# Patient Record
Sex: Male | Born: 1955 | Race: Black or African American | Hispanic: No | Smoking: Former smoker
Health system: Southern US, Community
[De-identification: ages and names within clinical notes are randomized; demographics above are authoritative.]

## PROBLEM LIST (undated history)

## (undated) DIAGNOSIS — H04129 Dry eye syndrome of unspecified lacrimal gland: Secondary | ICD-10-CM

## (undated) DIAGNOSIS — H53009 Unspecified amblyopia, unspecified eye: Secondary | ICD-10-CM

## (undated) DIAGNOSIS — Z87442 Personal history of urinary calculi: Secondary | ICD-10-CM

## (undated) DIAGNOSIS — M1990 Unspecified osteoarthritis, unspecified site: Secondary | ICD-10-CM

## (undated) DIAGNOSIS — I10 Essential (primary) hypertension: Secondary | ICD-10-CM

## (undated) DIAGNOSIS — C801 Malignant (primary) neoplasm, unspecified: Secondary | ICD-10-CM

## (undated) HISTORY — PX: OTHER SURGICAL HISTORY: SHX169

---

## 2001-10-24 ENCOUNTER — Encounter: Payer: Self-pay | Admitting: Family Medicine

## 2001-10-24 ENCOUNTER — Ambulatory Visit (HOSPITAL_COMMUNITY): Admission: RE | Admit: 2001-10-24 | Discharge: 2001-10-24 | Payer: Self-pay | Admitting: Family Medicine

## 2001-12-24 ENCOUNTER — Ambulatory Visit (HOSPITAL_COMMUNITY): Admission: RE | Admit: 2001-12-24 | Discharge: 2001-12-24 | Payer: Self-pay | Admitting: Surgery

## 2003-05-07 ENCOUNTER — Encounter: Admission: RE | Admit: 2003-05-07 | Discharge: 2003-05-07 | Payer: Self-pay | Admitting: Occupational Medicine

## 2004-11-09 ENCOUNTER — Ambulatory Visit (HOSPITAL_COMMUNITY): Admission: RE | Admit: 2004-11-09 | Discharge: 2004-11-09 | Payer: Self-pay | Admitting: Family Medicine

## 2009-12-20 ENCOUNTER — Emergency Department (HOSPITAL_COMMUNITY): Admission: EM | Admit: 2009-12-20 | Discharge: 2009-12-20 | Payer: Self-pay | Admitting: Emergency Medicine

## 2010-08-29 LAB — POCT I-STAT, CHEM 8
BUN: 19 mg/dL (ref 6–23)
Calcium, Ion: 1.19 mmol/L (ref 1.12–1.32)
Chloride: 108 mEq/L (ref 96–112)
Creatinine, Ser: 1.4 mg/dL (ref 0.4–1.5)
Glucose, Bld: 92 mg/dL (ref 70–99)
HCT: 52 % (ref 39.0–52.0)
Hemoglobin: 17.7 g/dL — ABNORMAL HIGH (ref 13.0–17.0)
Potassium: 4.4 mEq/L (ref 3.5–5.1)
Sodium: 141 mEq/L (ref 135–145)
TCO2: 26 mmol/L (ref 0–100)

## 2010-08-29 LAB — RAPID STREP SCREEN (MED CTR MEBANE ONLY): Streptococcus, Group A Screen (Direct): NEGATIVE

## 2010-10-29 NOTE — Op Note (Signed)
[place]  Patient:    Fred Carroll, Fred Carroll Visit Number: [id number] MRN: [id number]          Service Type: END Location: ENDO Attending Physician:  Andre Lefort Dictated by:   Kristine Garbe Ezzard Standing, M.D. Proc. Date: 12/24/01 Admit Date:  12/24/2001   CC:         Patrica Duel, M.D.   Operative Report  CCS#:  [id number]  DATE OF BIRTH:  August 28, 1955  PREOPERATIVE DIAGNOSIS:  Pelvic mass by CT scan with questionable attachment to colon.  POSTOPERATIVE DIAGNOSIS:  Completely normal colonoscopy except for rare diverticulosis.  PROCEDURE:  Flexible colonoscopy.  SURGEON:  Kristine Garbe. Ezzard Standing, M.D.  FIRST ASSISTANT:  None.  ANESTHESIA:  50 mg of Demerol, 4 mg of Versed.  COMPLICATIONS:  None.  INDICATIONS FOR PROCEDURE:  The patient is a 55 year old black male who underwent a CT scan on Oct 24, 2001.  The reading of the CT scan suggested a 1.9 cm pelvic mass which appeared benign but attached to the bowel at the rectosigmoid junction.  A suggestion was made that the patient undergo an endoscopy for evaluation of this mass.  The patient completed a GoLYTELY bowel prep at home. He had an IV in his right hand.  He was monitored with pulse oximetry and EKG and blood pressure cuff and was placed on nasal O2.  An Olympus colonoscope was then passed up his rectum around to his cecum without any trouble.  The ileocecal valve and cecum were visualized.  The right colon, transverse colon, and left colon were unremarkable. He did have maybe two or three diverticula of his sigmoid colon, but I saw no mucosal lesion or mass.  Particular attention was paid to the distal sigmoid colon, proximal rectum, which was suggested on the CT scan of where this mass ought to be; however, I saw no mass, no impingement on the bowel wall, and no mucosal defect.  The scope was unretroflexed within the rectum, and the rectum was unremarkable.  My  impression is that this pelvic mass is not attached or could not be seen from the colonoscope.  We will plan repeat CT scan in six months to ensure stability of the mass.  A discussion carried out with the patients parents at the end of the procedure, and he will see me back in six months. Dictated by:   Kristine Garbe Ezzard Standing, M.D. Attending Physician:  Andre Lefort DD:  12/24/01 TD:  12/25/01 Job: [phone] [id number]

## 2011-06-18 ENCOUNTER — Ambulatory Visit (INDEPENDENT_AMBULATORY_CARE_PROVIDER_SITE_OTHER): Payer: BC Managed Care – PPO

## 2011-06-18 DIAGNOSIS — J40 Bronchitis, not specified as acute or chronic: Secondary | ICD-10-CM

## 2011-06-18 DIAGNOSIS — R059 Cough, unspecified: Secondary | ICD-10-CM

## 2011-06-18 DIAGNOSIS — R05 Cough: Secondary | ICD-10-CM

## 2011-06-18 DIAGNOSIS — N529 Male erectile dysfunction, unspecified: Secondary | ICD-10-CM

## 2014-05-05 ENCOUNTER — Ambulatory Visit (INDEPENDENT_AMBULATORY_CARE_PROVIDER_SITE_OTHER): Payer: BC Managed Care – PPO

## 2014-05-05 ENCOUNTER — Ambulatory Visit (INDEPENDENT_AMBULATORY_CARE_PROVIDER_SITE_OTHER): Payer: BC Managed Care – PPO | Admitting: Podiatry

## 2014-05-05 VITALS — BP 169/88 | HR 74 | Resp 16

## 2014-05-05 DIAGNOSIS — M79672 Pain in left foot: Secondary | ICD-10-CM

## 2014-05-05 DIAGNOSIS — M722 Plantar fascial fibromatosis: Secondary | ICD-10-CM | POA: Insufficient documentation

## 2014-05-05 MED ORDER — TRIAMCINOLONE ACETONIDE 10 MG/ML IJ SUSP
10.0000 mg | Freq: Once | INTRAMUSCULAR | Status: AC
  Administered 2014-05-05: 10 mg

## 2014-05-05 NOTE — Patient Instructions (Signed)
Plantar Fasciitis (Heel Spur Syndrome) with Rehab The plantar fascia is a fibrous, ligament-like, soft-tissue structure that spans the bottom of the foot. Plantar fasciitis is a condition that causes pain in the foot due to inflammation of the tissue. SYMPTOMS   Pain and tenderness on the underneath side of the foot.  Pain that worsens with standing or walking. CAUSES  Plantar fasciitis is caused by irritation and injury to the plantar fascia on the underneath side of the foot. Common mechanisms of injury include:  Direct trauma to bottom of the foot.  Damage to a small nerve that runs under the foot where the main fascia attaches to the heel bone.  Stress placed on the plantar fascia due to bone spurs. RISK INCREASES WITH:   Activities that place stress on the plantar fascia (running, jumping, pivoting, or cutting).  Poor strength and flexibility.  Improperly fitted shoes.  Tight calf muscles.  Flat feet.  Failure to warm-up properly before activity.  Obesity. PREVENTION  Warm up and stretch properly before activity.  Allow for adequate recovery between workouts.  Maintain physical fitness:  Strength, flexibility, and endurance.  Cardiovascular fitness.  Maintain a health body weight.  Avoid stress on the plantar fascia.  Wear properly fitted shoes, including arch supports for individuals who have flat feet. PROGNOSIS  If treated properly, then the symptoms of plantar fasciitis usually resolve without surgery. However, occasionally surgery is necessary. RELATED COMPLICATIONS   Recurrent symptoms that may result in a chronic condition.  Problems of the lower back that are caused by compensating for the injury, such as limping.  Pain or weakness of the foot during push-off following surgery.  Chronic inflammation, scarring, and partial or complete fascia tear, occurring more often from repeated injections. TREATMENT  Treatment initially involves the use of  ice and medication to help reduce pain and inflammation. The use of strengthening and stretching exercises may help reduce pain with activity, especially stretches of the Achilles tendon. These exercises may be performed at home or with a therapist. Your caregiver may recommend that you use heel cups of arch supports to help reduce stress on the plantar fascia. Occasionally, corticosteroid injections are given to reduce inflammation. If symptoms persist for greater than 6 months despite non-surgical (conservative), then surgery may be recommended.  MEDICATION   If pain medication is necessary, then nonsteroidal anti-inflammatory medications, such as aspirin and ibuprofen, or other minor pain relievers, such as acetaminophen, are often recommended.  Do not take pain medication within 7 days before surgery.  Prescription pain relievers may be given if deemed necessary by your caregiver. Use only as directed and only as much as you need.  Corticosteroid injections may be given by your caregiver. These injections should be reserved for the most serious cases, because they may only be given a certain number of times. HEAT AND COLD  Cold treatment (icing) relieves pain and reduces inflammation. Cold treatment should be applied for 10 to 15 minutes every 2 to 3 hours for inflammation and pain and immediately after any activity that aggravates your symptoms. Use ice packs or massage the area with a piece of ice (ice massage).  Heat treatment may be used prior to performing the stretching and strengthening activities prescribed by your caregiver, physical therapist, or athletic trainer. Use a heat pack or soak the injury in warm water. SEEK IMMEDIATE MEDICAL CARE IF:  Treatment seems to offer no benefit, or the condition worsens.  Any medications produce adverse side effects. EXERCISES RANGE   OF MOTION (ROM) AND STRETCHING EXERCISES - Plantar Fasciitis (Heel Spur Syndrome) These exercises may help you  when beginning to rehabilitate your injury. Your symptoms may resolve with or without further involvement from your physician, physical therapist or athletic trainer. While completing these exercises, remember:   Restoring tissue flexibility helps normal motion to return to the joints. This allows healthier, less painful movement and activity.  An effective stretch should be held for at least 30 seconds.  A stretch should never be painful. You should only feel a gentle lengthening or release in the stretched tissue. RANGE OF MOTION - Toe Extension, Flexion  Sit with your right / left leg crossed over your opposite knee.  Grasp your toes and gently pull them back toward the top of your foot. You should feel a stretch on the bottom of your toes and/or foot.  Hold this stretch for __________ seconds.  Now, gently pull your toes toward the bottom of your foot. You should feel a stretch on the top of your toes and or foot.  Hold this stretch for __________ seconds. Repeat __________ times. Complete this stretch __________ times per day.  RANGE OF MOTION - Ankle Dorsiflexion, Active Assisted  Remove shoes and sit on a chair that is preferably not on a carpeted surface.  Place right / left foot under knee. Extend your opposite leg for support.  Keeping your heel down, slide your right / left foot back toward the chair until you feel a stretch at your ankle or calf. If you do not feel a stretch, slide your bottom forward to the edge of the chair, while still keeping your heel down.  Hold this stretch for __________ seconds. Repeat __________ times. Complete this stretch __________ times per day.  STRETCH - Gastroc, Standing  Place hands on wall.  Extend right / left leg, keeping the front knee somewhat bent.  Slightly point your toes inward on your back foot.  Keeping your right / left heel on the floor and your knee straight, shift your weight toward the wall, not allowing your back to  arch.  You should feel a gentle stretch in the right / left calf. Hold this position for __________ seconds. Repeat __________ times. Complete this stretch __________ times per day. STRETCH - Soleus, Standing  Place hands on wall.  Extend right / left leg, keeping the other knee somewhat bent.  Slightly point your toes inward on your back foot.  Keep your right / left heel on the floor, bend your back knee, and slightly shift your weight over the back leg so that you feel a gentle stretch deep in your back calf.  Hold this position for __________ seconds. Repeat __________ times. Complete this stretch __________ times per day. STRETCH - Gastrocsoleus, Standing  Note: This exercise can place a lot of stress on your foot and ankle. Please complete this exercise only if specifically instructed by your caregiver.   Place the ball of your right / left foot on a step, keeping your other foot firmly on the same step.  Hold on to the wall or a rail for balance.  Slowly lift your other foot, allowing your body weight to press your heel down over the edge of the step.  You should feel a stretch in your right / left calf.  Hold this position for __________ seconds.  Repeat this exercise with a slight bend in your right / left knee. Repeat __________ times. Complete this stretch __________ times per day.    STRENGTHENING EXERCISES - Plantar Fasciitis (Heel Spur Syndrome)  These exercises may help you when beginning to rehabilitate your injury. They may resolve your symptoms with or without further involvement from your physician, physical therapist or athletic trainer. While completing these exercises, remember:   Muscles can gain both the endurance and the strength needed for everyday activities through controlled exercises.  Complete these exercises as instructed by your physician, physical therapist or athletic trainer. Progress the resistance and repetitions only as guided. STRENGTH -  Towel Curls  Sit in a chair positioned on a non-carpeted surface.  Place your foot on a towel, keeping your heel on the floor.  Pull the towel toward your heel by only curling your toes. Keep your heel on the floor.  If instructed by your physician, physical therapist or athletic trainer, add ____________________ at the end of the towel. Repeat __________ times. Complete this exercise __________ times per day. STRENGTH - Ankle Inversion  Secure one end of a rubber exercise band/tubing to a fixed object (table, pole). Loop the other end around your foot just before your toes.  Place your fists between your knees. This will focus your strengthening at your ankle.  Slowly, pull your big toe up and in, making sure the band/tubing is positioned to resist the entire motion.  Hold this position for __________ seconds.  Have your muscles resist the band/tubing as it slowly pulls your foot back to the starting position. Repeat __________ times. Complete this exercises __________ times per day.  Document Released: 05/30/2005 Document Revised: 08/22/2011 Document Reviewed: 09/11/2008 [employer] Patient Information 2015 [employer]. This information is not intended to replace advice given to you by your health care provider. Make sure you discuss any questions you have with your health care provider.  

## 2014-05-05 NOTE — Progress Notes (Signed)
   Subjective:    Patient ID: Fred Carroll, male    DOB: 1956/03/06, 58 y.o.   MRN: [id number]  HPI 58 year old male presents the office they with complaints of left heel pain which is been ongoing for approximately one month. He states that he has been using a frozen water bottle massage the bottom of his foot which seems to help alleviate the pain somewhat. He is also tried over-the-counter inserts. He states that he has pain mostly in the mornings or after periods of rest or after he has been on his feet all day. He denies any recent injury or trauma to the area or any recent increase in activity. No other complaints at this time.  Review of Systems  All other systems reviewed and are negative.      Objective:   Physical Exam AAO x3, NAD DP/PT pulses palpable bilaterally, CRT less than 3 seconds Protective sensation intact with Simms Weinstein monofilament, vibratory sensation intact, Achilles tendon reflex intact Tenderness palpation of the plantar medial tubercle of the left calcaneus at the insertion of the plantar fascia. There is no pain along the course of the plantar fascia within the arch of the foot. There is no pain with lateral compression of the calcaneus or pain with vibratory sensation. No pain along the posterior aspect of the calcaneus or along the course/insertion of the Achilles tendon. There is no overlying edema, erythema, increase in warmth. MMT 5/5, ROM WNL No open lesions or pre-ulcerative lesions. No pain with calf compression, swelling, warmth, erythema.       Assessment & Plan:  58 year old male with left heel pain, likely plantar fasciitis. -X-rays were obtained and reviewed with the patient. -Conservative versus surgical treatment discussed including alternatives, risks, complications. -Patient elects to proceed with steroid injection into the left heel. Under sterile skin preparation, a total of 2.5cc of kenalog 10, 0.5% Marcaine plain, and 2%  lidocaine plain were infiltrated into the symptomatic area without complication. A band-aid was applied. Patient tolerated the injection well without complication. Discussed to continue icing over the next couple days to help prevent a steroid flare. -Discussed stretching exercises. -Ice to the affected area. -Discussed shoe gear modification/inserts for which he has done. -Follow-up in 3 weeks. In the meantime, call the office with any questions, concerns, changes symptoms.

## 2014-05-22 ENCOUNTER — Other Ambulatory Visit (HOSPITAL_COMMUNITY): Payer: Self-pay | Admitting: Physician Assistant

## 2014-05-23 ENCOUNTER — Other Ambulatory Visit (HOSPITAL_COMMUNITY): Payer: Self-pay | Admitting: Physician Assistant

## 2014-05-23 DIAGNOSIS — E049 Nontoxic goiter, unspecified: Secondary | ICD-10-CM

## 2014-05-26 ENCOUNTER — Ambulatory Visit (HOSPITAL_COMMUNITY): Payer: BC Managed Care – PPO

## 2014-05-28 ENCOUNTER — Ambulatory Visit: Payer: BC Managed Care – PPO | Admitting: Podiatry

## 2014-05-28 ENCOUNTER — Ambulatory Visit (INDEPENDENT_AMBULATORY_CARE_PROVIDER_SITE_OTHER): Payer: BC Managed Care – PPO | Admitting: Podiatry

## 2014-05-28 VITALS — BP 160/106 | HR 69 | Resp 18

## 2014-05-28 DIAGNOSIS — M79672 Pain in left foot: Secondary | ICD-10-CM

## 2014-05-28 DIAGNOSIS — M722 Plantar fascial fibromatosis: Secondary | ICD-10-CM

## 2014-06-01 NOTE — Progress Notes (Signed)
Patient ID: Fred Carroll, male   DOB: [birth year], 58 y.o.   MRN: [id number]  Subjective: 58 year old male returns the office they for follow-up evaluation of left heel pain, plantar fasciitis. The patient states that he is much improved. He's been continuing with the stretching icing activities. He gets some mild intermittent discomfort at times however states he has significantly improved compared to prior. No acute changes since last appointment and no other complaints at this time. Denies any systemic complaints such as fevers, chills, nausea, vomiting.  Objective: AAO 3, NAD DP/PT pulses palpable bilaterally, CRT less than 3 seconds  Protective sensation intact with Simms Weinstein monofilament, vibratory sensation intact, Achilles tendon reflex intact. At this time there is no tenderness to palpation overlying the plantar medial tubercle of the calcaneus at the insertion the plantar fascia on the left foot. There is no pain with lateral compression of the calcaneus or pain with vibratory sensation. No pain on the posterior aspect of the calcaneus or on the course last insertion of the Achilles tendon. The plantar fascia appears to be intact. There is no overlying edema, erythema, increased warmth. MMT 5/5, ROM WNL No pain to the right lower extremity. No pain lesions or pre-ulcerative lesions.  No pain with calf compression, swelling, warmth, erythema.  Assessment:  58 year old male follow up evaluation left heel pain, plantar fasciitis  Plan: -Treatment options were discussed with the patient including alternatives, risks, complications. -At this time continue stretching and icing exercises. -We'll hold off on another steroid injection at this time. -Dispensed plantar fascial brace per patient's request. -Continue shoe gear modifications. -Follow-up as needed. In the meantime, call the office with any questions, concerns, change in symptoms. Follow-up with PCP for blood  pressure. Currently, the patient is asymptomatic.

## 2015-09-16 ENCOUNTER — Telehealth: Payer: Self-pay

## 2015-09-17 NOTE — Telephone Encounter (Signed)
Gastroenterology Pre-Procedure Review  Request Date: 09/16/2015 Requesting Physician: Rowan Blase, PA  PATIENT REVIEW QUESTIONS: The patient responded to the following health history questions as indicated:    Pt's mom Barnetta Chapel called and gave the info for pt and got him scheduled. He was just busy. She said he does sign for himself.   1. Diabetes Melitis: no 2. Joint replacements in the past 12 months: no 3. Major health problems in the past 3 months: no 4. Has an artificial valve or MVP: no 5. Has a defibrillator: no 6. Has been advised in past to take antibiotics in advance of a procedure like teeth cleaning: no 7. Family history of colon cancer: no  8. Alcohol Use: has a beer maybe once a week 9. History of sleep apnea: no     MEDICATIONS & ALLERGIES:    Patient reports the following regarding taking any blood thinners:   Plavix? no Aspirin? no Coumadin? no  Patient confirms/reports the following medications:  Current Outpatient Prescriptions  Medication Sig Dispense Refill  . losartan-hydrochlorothiazide (HYZAAR) 100-12.5 MG per tablet Take 1 tablet by mouth daily.    . simvastatin (ZOCOR) 20 MG tablet Take 20 mg by mouth daily.     No current facility-administered medications for this visit.    Patient confirms/reports the following allergies:  No Known Allergies  No orders of the defined types were placed in this encounter.    AUTHORIZATION INFORMATION Primary Insurance:  ID #:  Group #:  Pre-Cert / Auth required:  Pre-Cert / Auth #:   Secondary Insurance:   ID #:   Group #:  Pre-Cert / Auth required: Pre-Cert / Auth #:   SCHEDULE INFORMATION: Procedure has been scheduled as follows:  Date:  10/12/2015              Time: 2:30 PM  Location: [facility] Short Stay   This Gastroenterology Pre-Precedure Review Form is being routed to the following provider(s): Barney Drain, MD

## 2015-10-01 NOTE — Telephone Encounter (Signed)
MOVI PREP SPLIT DOSING, REGULAR BREAKFAST. CLEAR LIQUIDS AFTER 9 AM.  

## 2015-10-05 ENCOUNTER — Other Ambulatory Visit: Payer: Self-pay

## 2015-10-05 DIAGNOSIS — Z1211 Encounter for screening for malignant neoplasm of colon: Secondary | ICD-10-CM

## 2015-10-05 MED ORDER — PEG-KCL-NACL-NASULF-NA ASC-C 100 G PO SOLR: 1.0000 | ORAL | Status: DC

## 2015-10-05 NOTE — Telephone Encounter (Signed)
Rx sent to the pharmacy and instructions mailed to pt.  

## 2015-10-07 ENCOUNTER — Telehealth: Payer: Self-pay

## 2015-10-07 NOTE — Telephone Encounter (Signed)
i have faxed the paper work for the PA for her TCS. Waiting for a return fax

## 2015-10-12 ENCOUNTER — Encounter (HOSPITAL_COMMUNITY)
Admission: RE | Disposition: A | Discharge status: Home / Self Care | Payer: Self-pay | Source: Ambulatory Visit | Attending: Gastroenterology

## 2015-10-12 ENCOUNTER — Encounter (HOSPITAL_COMMUNITY): Payer: Self-pay | Admitting: *Deleted

## 2015-10-12 ENCOUNTER — Ambulatory Visit (HOSPITAL_COMMUNITY)
Admission: RE | Admit: 2015-10-12 | Discharge: 2015-10-12 | Disposition: A | Discharge status: Home / Self Care | Payer: BLUE CROSS/BLUE SHIELD | Source: Ambulatory Visit | Attending: Gastroenterology | Admitting: Gastroenterology

## 2015-10-12 DIAGNOSIS — Z79899 Other long term (current) drug therapy: Secondary | ICD-10-CM | POA: Diagnosis not present

## 2015-10-12 DIAGNOSIS — K644 Residual hemorrhoidal skin tags: Secondary | ICD-10-CM | POA: Diagnosis not present

## 2015-10-12 DIAGNOSIS — D123 Benign neoplasm of transverse colon: Secondary | ICD-10-CM | POA: Diagnosis not present

## 2015-10-12 DIAGNOSIS — K648 Other hemorrhoids: Secondary | ICD-10-CM | POA: Diagnosis not present

## 2015-10-12 DIAGNOSIS — Z1211 Encounter for screening for malignant neoplasm of colon: Secondary | ICD-10-CM | POA: Insufficient documentation

## 2015-10-12 DIAGNOSIS — D126 Benign neoplasm of colon, unspecified: Secondary | ICD-10-CM | POA: Diagnosis not present

## 2015-10-12 DIAGNOSIS — F17210 Nicotine dependence, cigarettes, uncomplicated: Secondary | ICD-10-CM | POA: Diagnosis not present

## 2015-10-12 DIAGNOSIS — K5730 Diverticulosis of large intestine without perforation or abscess without bleeding: Secondary | ICD-10-CM | POA: Diagnosis not present

## 2015-10-12 DIAGNOSIS — I10 Essential (primary) hypertension: Secondary | ICD-10-CM | POA: Insufficient documentation

## 2015-10-12 HISTORY — PX: COLONOSCOPY: SHX5424

## 2015-10-12 HISTORY — DX: Essential (primary) hypertension: I10

## 2015-10-12 SURGERY — COLONOSCOPY
Anesthesia: Moderate Sedation

## 2015-10-12 MED ORDER — SODIUM CHLORIDE 0.9 % IV SOLN
INTRAVENOUS | Status: DC
  Administered 2015-10-12: 14:00:00 via INTRAVENOUS

## 2015-10-12 MED ORDER — MEPERIDINE HCL 100 MG/ML IJ SOLN
INTRAMUSCULAR | Status: AC
  Filled 2015-10-12: qty 2

## 2015-10-12 MED ORDER — MIDAZOLAM HCL 5 MG/5ML IJ SOLN
INTRAMUSCULAR | Status: AC
  Filled 2015-10-12: qty 10

## 2015-10-12 MED ORDER — MIDAZOLAM HCL 5 MG/5ML IJ SOLN
INTRAMUSCULAR | Status: DC | PRN
  Administered 2015-10-12 (×2): 2 mg via INTRAVENOUS

## 2015-10-12 MED ORDER — MEPERIDINE HCL 100 MG/ML IJ SOLN
INTRAMUSCULAR | Status: DC | PRN
  Administered 2015-10-12: 25 mg via INTRAVENOUS
  Administered 2015-10-12: 50 mg via INTRAVENOUS

## 2015-10-12 NOTE — H&P (Signed)
  Primary Care Physician:  Purvis Kilts, MD Primary Gastroenterologist:  Dr. Oneida Alar  Pre-Procedure History & Physical: HPI:  Fred Carroll is a 60 y.o. male here for [place].  Past Medical History  Diagnosis Date  . Hypertension     History reviewed. No pertinent past surgical history.  Prior to Admission medications   Medication Sig Start Date End Date Taking? Authorizing Provider  losartan-hydrochlorothiazide (HYZAAR) 100-12.5 MG per tablet Take 1 tablet by mouth daily.   Yes Historical Provider, MD  peg 3350 powder (MOVIPREP) 100 g SOLR Take 1 kit (200 g total) by mouth as directed. 10/05/15  Yes Danie Binder, MD  simvastatin (ZOCOR) 20 MG tablet Take 20 mg by mouth daily.   Yes Historical Provider, MD    Allergies as of 10/05/2015  . (No Known Allergies)    History reviewed. No pertinent family history.  Social History   Social History  . Marital Status: Divorced    Spouse Name: N/A  . Number of Children: N/A  . Years of Education: N/A   Occupational History  . Not on file.   Social History Main Topics  . Smoking status: Current Some Day Smoker    Types: Cigarettes  . Smokeless tobacco: Not on file  . Alcohol Use: Not on file  . Drug Use: Not on file  . Sexual Activity: Not on file   Other Topics Concern  . Not on file   Social History Narrative  . No narrative on file    Review of Systems: See HPI, otherwise negative ROS   Physical Exam: There were no vitals taken for this visit. General:   Alert,  pleasant and cooperative in NAD Head:  Normocephalic and atraumatic. Neck:  Supple; Lungs:  Clear throughout to auscultation.    Heart:  Regular rate and rhythm. Abdomen:  Soft, nontender and nondistended. Normal bowel sounds, without guarding, and without rebound.   Neurologic:  Alert and  oriented x4;  grossly normal neurologically.  Impression/Plan:     SCREENING  Plan:  1. TCS TODAY

## 2015-10-12 NOTE — Op Note (Signed)
[facility] Patient Name: Fred Carroll Procedure Date: 10/12/2015 1:41 PM MRN: [id number] Date of Birth: Jul 19, 1955 Attending MD: Barney Drain , MD CSN: [id number] Age: 60 Admit Type: Outpatient Procedure:                Colonoscopy Indications:              Screening for colorectal malignant neoplasm Providers:                Barney Drain, MD, Janeece Riggers, RN, Randa Spike,                            Technician Referring MD:              Medicines:                Meperidine 75 mg IV, Midazolam 4 mg IV Complications:            No immediate complications. Estimated Blood Loss:     Estimated blood loss was minimal. Procedure:                Pre-Anesthesia Assessment:                           - Prior to the procedure, a History and Physical                            was performed, and patient medications and                            allergies were reviewed. The patient's tolerance of                            previous anesthesia was also reviewed. The risks                            and benefits of the procedure and the sedation                            options and risks were discussed with the patient.                            All questions were answered, and informed consent                            was obtained. Prior Anticoagulants: The patient has                            taken no previous anticoagulant or antiplatelet                            agents. ASA Grade Assessment: I - A normal, healthy                            patient. After reviewing the risks and benefits,  the patient was deemed in satisfactory condition to                            undergo the procedure.                           After obtaining informed consent, the colonoscope                            was passed under direct vision. Throughout the                            procedure, the patient's blood pressure, pulse, and   oxygen saturations were monitored continuously. The                            [id number] scope was introduced through                            the anus and advanced to the the cecum, identified                            by appendiceal orifice and ileocecal valve. The                            ileocecal valve, appendiceal orifice, and rectum                            were photographed. The colonoscopy was performed                            with ease. The patient tolerated the procedure                            well. The quality of the bowel preparation was                            excellent. Scope In: 2:24:37 PM Scope Out: 2:50:05 PM Scope Withdrawal Time: 0 hours 21 minutes 39 seconds  Total Procedure Duration: 0 hours 25 minutes 28 seconds  Findings:      The digital rectal exam was normal.      Three sessile polyps were found in the distal transverse colon and       hepatic flexure. The polyps were 6 to 7 mm in size. These polyps were       removed with a hot snare. Resection and retrieval were complete.      Three sessile polyps were found in the distal transverse colon and       hepatic flexure. The polyps were 2 to 4 mm in size. These polyps were       removed with a cold biopsy forceps. Resection and retrieval were       complete.      Multiple small and large-mouthed diverticula were found in the sigmoid       colon, descending colon and distal transverse colon.      Non-bleeding external and internal  hemorrhoids were found. Impression:               - Three 6 to 7 mm polyps in the distal transverse                            colon and at the hepatic flexure, removed with a                            hot snare. Resected and retrieved.                           - Three 2 to 4 mm polyps in the distal transverse                            colon and at the hepatic flexure, removed with a                            cold biopsy forceps. Resected and retrieved.                            - Diverticulosis in the sigmoid colon, in the                            descending colon and in the distal transverse colon.                           - Non-bleeding external and internal hemorrhoids. Moderate Sedation:      Moderate (conscious) sedation was administered by the endoscopy nurse       and supervised by the endoscopist. The following parameters were       monitored: oxygen saturation, heart rate, blood pressure, and response       to care. Total physician intraservice time was 39 minutes. Recommendation:           - Patient has a contact number available for                            emergencies. The signs and symptoms of potential                            delayed complications were discussed with the                            patient. Return to normal activities tomorrow.                            Written discharge instructions were provided to the                            patient.                           - High fiber diet.                           -  Continue present medications.                           - Await pathology results.                           - Repeat colonoscopy in 3 - 5 years for                            surveillance. Procedure Code(s):        --- Professional ---                           [phone], Colonoscopy, flexible; with removal of                            tumor(s), polyp(s), or other lesion(s) by snare                            technique                           45380, 59, Colonoscopy, flexible; with biopsy,                            single or multiple                           99153, Moderate sedation services; each additional                            15 minutes intraservice time                           99153, Moderate sedation services; each additional                            15 minutes intraservice time                           G0500, Moderate sedation services provided by the                             same physician or other qualified health care                            professional performing a gastrointestinal                            endoscopic service that sedation supports,                            requiring the presence of an independent trained                            observer to assist in the monitoring of the  patient's level of consciousness and physiological                            status; initial 15 minutes of intra-service time;                            patient age 58 years or older (additional time may                            be reported with [phone], as appropriate) Diagnosis Code(s):        --- Professional ---                           Z12.11, Encounter for screening for malignant                            neoplasm of colon                           D12.3, Benign neoplasm of transverse colon (hepatic                            flexure or splenic flexure)                           K64.8, Other hemorrhoids                           K57.30, Diverticulosis of large intestine without                            perforation or abscess without bleeding CPT copyright 2016 [employer] Association. All rights reserved. The codes documented in this report are preliminary and upon coder review may  be revised to meet current compliance requirements. Barney Drain, MD Barney Drain, MD 10/12/2015 3:32:43 PM This report has been signed electronically. Number of Addenda: 0

## 2015-10-12 NOTE — Discharge Instructions (Addendum)
You have small internal hemorrhoids. YOU HAD SIX POLYPS REMOVED FROM YOUR RIGHT COLON.   DRINK WATER TO KEEP YOUR URINE LIGHT YELLOW.  FOLLOW A HIGH FIBER DIET. AVOID ITEMS THAT CAUSE BLOATING. See info below.  YOUR BIOPSY RESULTS WILL BE AVAILABLE IN MY CHART MAY 4 AND MY OFFICE WILL CONTACT YOU IN 10-14 DAYS WITH YOUR RESULTS.   Next colonoscopy in 3-5 years.  Colonoscopy Care After Read the instructions outlined below and refer to this sheet in the next week. These discharge instructions provide you with general information on caring for yourself after you leave the hospital. While your treatment has been planned according to the most current medical practices available, unavoidable complications occasionally occur. If you have any problems or questions after discharge, call DR. FIELDS, [phone].  ACTIVITY  You may resume your regular activity, but move at a slower pace for the next 24 hours.   Take frequent rest periods for the next 24 hours.   Walking will help get rid of the air and reduce the bloated feeling in your belly (abdomen).   No driving for 24 hours (because of the medicine (anesthesia) used during the test).   You may shower.   Do not sign any important legal documents or operate any machinery for 24 hours (because of the anesthesia used during the test).    NUTRITION  Drink plenty of fluids.   You may resume your normal diet as instructed by your doctor.   Begin with a light meal and progress to your normal diet. Heavy or fried foods are harder to digest and may make you feel sick to your stomach (nauseated).   Avoid alcoholic beverages for 24 hours or as instructed.    MEDICATIONS  You may resume your normal medications.   WHAT YOU CAN EXPECT TODAY  Some feelings of bloating in the abdomen.   Passage of more gas than usual.   Spotting of blood in your stool or on the toilet paper  .  IF YOU HAD POLYPS REMOVED DURING THE COLONOSCOPY:  Eat  a soft diet IF YOU HAVE NAUSEA, BLOATING, ABDOMINAL PAIN, OR VOMITING.    FINDING OUT THE RESULTS OF YOUR TEST Not all test results are available during your visit. DR. Oneida Alar WILL CALL YOU WITHIN 14 DAYS OF YOUR PROCEDUE WITH YOUR RESULTS. Do not assume everything is normal if you have not heard from DR. FIELDS, CALL HER OFFICE AT [phone].  SEEK IMMEDIATE MEDICAL ATTENTION AND CALL THE OFFICE: [phone] IF:  You have more than a spotting of blood in your stool.   Your belly is swollen (abdominal distention).   You are nauseated or vomiting.   You have a temperature over 101F.   You have abdominal pain or discomfort that is severe or gets worse throughout the day.  High-Fiber Diet A high-fiber diet changes your normal diet to include more whole grains, legumes, fruits, and vegetables. Changes in the diet involve replacing refined carbohydrates with unrefined foods. The calorie level of the diet is essentially unchanged. The Dietary Reference Intake (recommended amount) for adult males is 38 grams per day. For adult females, it is 25 grams per day. Pregnant and lactating women should consume 28 grams of fiber per day. Fiber is the intact part of a plant that is not broken down during digestion. Functional fiber is fiber that has been isolated from the plant to provide a beneficial effect in the body. PURPOSE  Increase stool bulk.   Ease and regulate  bowel movements.   Lower cholesterol.  REDUCE RISK OF COLON CANCER  INDICATIONS THAT YOU NEED MORE FIBER  Constipation and hemorrhoids.   Uncomplicated diverticulosis (intestine condition) and irritable bowel syndrome.   Weight management.   As a protective measure against hardening of the arteries (atherosclerosis), diabetes, and cancer.   GUIDELINES FOR INCREASING FIBER IN THE DIET  Start adding fiber to the diet slowly. A gradual increase of about 5 more grams (2 slices of whole-wheat bread, 2 servings of most fruits or  vegetables, or 1 bowl of high-fiber cereal) per day is best. Too rapid an increase in fiber may result in constipation, flatulence, and bloating.   Drink enough water and fluids to keep your urine clear or pale yellow. Water, juice, or caffeine-free drinks are recommended. Not drinking enough fluid may cause constipation.   Eat a variety of high-fiber foods rather than one type of fiber.   Try to increase your intake of fiber through using high-fiber foods rather than fiber pills or supplements that contain small amounts of fiber.   The goal is to change the types of food eaten. Do not supplement your present diet with high-fiber foods, but replace foods in your present diet.   INCLUDE A VARIETY OF FIBER SOURCES  Replace refined and processed grains with whole grains, canned fruits with fresh fruits, and incorporate other fiber sources. White rice, white breads, and most bakery goods contain little or no fiber.   Brown whole-grain rice, buckwheat oats, and many fruits and vegetables are all good sources of fiber. These include: broccoli, Brussels sprouts, cabbage, cauliflower, beets, sweet potatoes, white potatoes (skin on), carrots, tomatoes, eggplant, squash, berries, fresh fruits, and dried fruits.   Cereals appear to be the richest source of fiber. Cereal fiber is found in whole grains and bran. Bran is the fiber-rich outer coat of cereal grain, which is largely removed in refining. In whole-grain cereals, the bran remains. In breakfast cereals, the largest amount of fiber is found in those with "bran" in their names. The fiber content is sometimes indicated on the label.   You may need to include additional fruits and vegetables each day.   In baking, for 1 cup white flour, you may use the following substitutions:   1 cup whole-wheat flour minus 2 tablespoons.   1/2 cup white flour plus 1/2 cup whole-wheat flour.   Polyps, Colon  A polyp is extra tissue that grows inside your body.  Colon polyps grow in the large intestine. The large intestine, also called the colon, is part of your digestive system. It is a long, hollow tube at the end of your digestive tract where your body makes and stores stool. Most polyps are not dangerous. They are benign. This means they are not cancerous. But over time, some types of polyps can turn into cancer. Polyps that are smaller than a pea are usually not harmful. But larger polyps could someday become or may already be cancerous. To be safe, doctors remove all polyps and test them.   PREVENTION There is not one sure way to prevent polyps. You might be able to lower your risk of getting them if you:  Eat more fruits and vegetables and less fatty food.   Do not smoke.   Avoid alcohol.   Exercise every day.   Lose weight if you are overweight.   Eating more calcium and folate can also lower your risk of getting polyps. Some foods that are rich in calcium are milk,  cheese, and broccoli. Some foods that are rich in folate are chickpeas, kidney beans, and spinach.    Diverticulosis Diverticulosis is a common condition that develops when small pouches (diverticula) form in the wall of the colon. The risk of diverticulosis increases with age. It happens more often in people who eat a low-fiber diet. Most individuals with diverticulosis have no symptoms. Those individuals with symptoms usually experience belly (abdominal) pain, constipation, or loose stools (diarrhea).  HOME CARE INSTRUCTIONS  Increase the amount of fiber in your diet as directed by your caregiver or dietician. This may reduce symptoms of diverticulosis.   Drink at least 6 to 8 glasses of water each day to prevent constipation.   Try not to strain when you have a bowel movement.   Avoiding nuts and seeds to prevent complications is NOT NECESSARY.   FOODS HAVING HIGH FIBER CONTENT INCLUDE:  Fruits. Apple, peach, pear, tangerine, raisins, prunes.   Vegetables. Brussels  sprouts, asparagus, broccoli, cabbage, carrot, cauliflower, romaine lettuce, spinach, summer squash, tomato, winter squash, zucchini.   Starchy Vegetables. Baked beans, kidney beans, lima beans, split peas, lentils, potatoes (with skin).   Grains. Whole wheat bread, brown rice, bran flake cereal, plain oatmeal, white rice, shredded wheat, bran muffins.   SEEK IMMEDIATE MEDICAL CARE IF:  You develop increasing pain or severe bloating.   You have an oral temperature above 101F.   You develop vomiting or bowel movements that are bloody or black.   Hemorrhoids Hemorrhoids are dilated (enlarged) veins around the rectum. Sometimes clots will form in the veins. This makes them swollen and painful. These are called thrombosed hemorrhoids. Causes of hemorrhoids include:  Constipation.   Straining to have a bowel movement.   HEAVY LIFTING  HOME CARE INSTRUCTIONS  Eat a well balanced diet and drink 6 to 8 glasses of water every day to avoid constipation. You may also use a bulk laxative.   Avoid straining to have bowel movements.   Keep anal area dry and clean.   Do not use a donut shaped pillow or sit on the toilet for long periods. This increases blood pooling and pain.   Move your bowels when your body has the urge; this will require less straining and will decrease pain and pressure.

## 2015-10-16 ENCOUNTER — Encounter (HOSPITAL_COMMUNITY): Payer: Self-pay | Admitting: Gastroenterology

## 2015-10-20 ENCOUNTER — Telehealth: Payer: Self-pay | Admitting: Gastroenterology

## 2015-10-20 NOTE — Telephone Encounter (Signed)
Pt called for results of colonoscopy. Please call [phone]

## 2015-10-27 NOTE — Telephone Encounter (Signed)
DRINK WATER TO KEEP YOUR URINE LIGHT YELLOW.  FOLLOW A HIGH FIBER DIET. AVOID ITEMS THAT CAUSE BLOATING.  Next colonoscopy in 3 years.

## 2015-10-27 NOTE — Telephone Encounter (Signed)
Called and [facility] for pt that I am very sorry for the delay in returning his call. I have been out sick some and I am very sorry. We do have 7-10 business days to get the results and I realize that time is here now. Dr. Oneida Alar is on vacation and will be back at the hospital on Friday this week.  Also, said for him to call me if he has problems or concerns at this time.

## 2015-10-27 NOTE — Telephone Encounter (Signed)
LMOM to call.

## 2015-10-28 NOTE — Telephone Encounter (Signed)
Reminder in epic °

## 2015-11-02 NOTE — Telephone Encounter (Signed)
L/m for pt to return call

## 2015-11-03 NOTE — Telephone Encounter (Signed)
PT is aware of results.  

## 2016-08-31 ENCOUNTER — Ambulatory Visit (INDEPENDENT_AMBULATORY_CARE_PROVIDER_SITE_OTHER): Payer: BLUE CROSS/BLUE SHIELD | Admitting: Physician Assistant

## 2016-08-31 ENCOUNTER — Ambulatory Visit: Payer: BLUE CROSS/BLUE SHIELD

## 2016-08-31 VITALS — BP 108/80 | HR 71 | Temp 97.9°F | Resp 16 | Ht 67.0 in | Wt 194.0 lb

## 2016-08-31 DIAGNOSIS — M79672 Pain in left foot: Secondary | ICD-10-CM

## 2016-08-31 DIAGNOSIS — M722 Plantar fascial fibromatosis: Secondary | ICD-10-CM | POA: Diagnosis not present

## 2016-08-31 NOTE — Progress Notes (Signed)
   Fred Carroll  MRN: [id number] DOB: 1956/01/30  PCP: Purvis Kilts, MD  Subjective:  Pt is a 61 year old male who presents to clinic for left heel pain.  H/o plantar fasciitis of left foot, this is a known problem for pt. Has seen ortho in the past - had steroid injections in 2015. He felt good until about 3 months ago. 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 He would like a note asking to be switched back to his former position on the belt.  He wears steel-toe boots with orthotics.  Denies n/t, swelling, color changes, temperature changes.  He has appt next week with [facility].   Review of Systems  Musculoskeletal: Positive for arthralgias (left heel) and gait problem.    Patient Active Problem List   Diagnosis Date Noted  . Special screening for malignant neoplasms, colon   . Plantar fasciitis of left foot 05/05/2014    Current Outpatient Prescriptions on File Prior to Visit  Medication Sig Dispense Refill  . losartan-hydrochlorothiazide (HYZAAR) 100-12.5 MG per tablet Take 1 tablet by mouth daily.    . simvastatin (ZOCOR) 20 MG tablet Take 20 mg by mouth daily.     No current facility-administered medications on file prior to visit.     No Known Allergies   Objective:  BP 108/80   Pulse 71   Temp 97.9 F (36.6 C) (Oral)   Resp 16   Ht 5\' 7"  (1.702 m)   Wt 194 lb (88 kg)   SpO2 96%   BMI 30.38 kg/m   Physical Exam  Constitutional: He is oriented to person, place, and time and well-developed, well-nourished, and in no distress. No distress.  Cardiovascular: Normal rate, regular rhythm and normal heart sounds.   Musculoskeletal:       Left foot: There is tenderness (calcaneous ). There is normal range of motion, no bony tenderness, no swelling, normal capillary refill, no deformity and no laceration.  Neurological: He is alert  and oriented to person, place, and time. GCS score is 15.  Skin: Skin is warm and dry.  Psychiatric: Mood, memory, affect and judgment normal.  Vitals reviewed.   Assessment and Plan :  1. Plantar fasciitis of left foot 2. Foot pain, left - Increased b/l foot pain likely due to exacerbation of plantar fasciitis due to his recent position change at work. Will write letter requesting him to be switched back to former position. Advised pt to keep f/u appt with ortho next week. RTC in symptoms worsen.    Mercer Pod, PA-C  [facility] [address] 08/31/2016 12:16 PM

## 2016-08-31 NOTE — Patient Instructions (Signed)
     IF you received an x-ray today, you will receive an invoice from [facility]. Please contact [facility] at [phone] with questions or concerns regarding your invoice.   IF you received labwork today, you will receive an invoice from [facility]. Please contact [facility] at [phone] with questions or concerns regarding your invoice.   Our billing staff will not be able to assist you with questions regarding bills from these companies.  You will be contacted with the lab results as soon as they are available. The fastest way to get your results is to activate your My Chart account. Instructions are located on the last page of this paperwork. If you have not heard from us regarding the results in 2 weeks, please contact this office.     

## 2016-09-09 ENCOUNTER — Encounter: Payer: Self-pay | Admitting: Podiatry

## 2016-09-09 ENCOUNTER — Ambulatory Visit (INDEPENDENT_AMBULATORY_CARE_PROVIDER_SITE_OTHER): Payer: BLUE CROSS/BLUE SHIELD | Admitting: Podiatry

## 2016-09-09 DIAGNOSIS — M722 Plantar fascial fibromatosis: Secondary | ICD-10-CM | POA: Diagnosis not present

## 2016-09-09 MED ORDER — TRIAMCINOLONE ACETONIDE 10 MG/ML IJ SUSP
10.0000 mg | Freq: Once | INTRAMUSCULAR | Status: AC
  Administered 2016-09-09: 10 mg

## 2016-09-09 MED ORDER — MELOXICAM 15 MG PO TABS: 15.0000 mg | ORAL_TABLET | Freq: Every day | ORAL | 2 refills | Status: DC

## 2016-09-09 NOTE — Progress Notes (Signed)
Subjective: 61 year old male presents the office today for concerns or reoccurrence of left heel pain. He states that her last couple days he started no some mild recurrence of pain he went to have an injection done before the pain to be sutured severe. He did purchase an over-the-counter brace which helps some but he is asking for another brace as well. He has been stretching icing an intermittent basis but not continual. 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 He denies any numbness or tingling. The pain is at night. No other complaints today. Denies any systemic complaints such as fevers, chills, nausea, vomiting. No acute changes since last appointment.  Objective: AAO x3, NAD DP/PT pulses palpable bilaterally, CRT less than 3 seconds There is mild reoccurrence of tenderness to palpation along the plantar medial tubercle of the calcaneus at the insertion of plantar fascia on the left foot. There is no pain along the course of the plantar fascia within the arch of the foot. Plantar fascia appears to be intact. There is no pain with lateral compression of the calcaneus or pain with vibratory sensation. There is no pain along the course or insertion of the achilles tendon. No other areas of tenderness to bilateral lower extremities. No open lesions or pre-ulcerative lesions.  No pain with calf compression, swelling, warmth, erythema  Assessment: Left heel pain, plantar fasciitis recurrence  Plan: -All treatment options discussed with the patient including all alternatives, risks, complications.  -Patient elects to proceed with steroid injection into the left heel. Under sterile skin preparation, a total of 2.5cc of kenalog 10, 0.5% Marcaine plain, and 2% lidocaine plain were infiltrated into the symptomatic area without complication. A band-aid was applied. Patient tolerated the injection well without complication. Post-injection care with discussed with the patient. Discussed  with the patient to ice the area over the next couple of days to help prevent a steroid flare.  -Plantar fascial braces Spence. -Stretching, icing exercises daily. -Discussed the symptoms continue custom molded orthotic. He has purchased an over-the-counter insert which is helping some. -RTC in 3-4 weeks or sooner if needed. Call any questions concerns meantime.  Celesta Gentile, DPM

## 2016-09-09 NOTE — Patient Instructions (Signed)

## 2016-10-07 ENCOUNTER — Encounter: Payer: Self-pay | Admitting: Podiatry

## 2016-10-07 ENCOUNTER — Ambulatory Visit (INDEPENDENT_AMBULATORY_CARE_PROVIDER_SITE_OTHER): Payer: BLUE CROSS/BLUE SHIELD | Admitting: Podiatry

## 2016-10-07 DIAGNOSIS — M722 Plantar fascial fibromatosis: Secondary | ICD-10-CM | POA: Diagnosis not present

## 2016-10-07 NOTE — Patient Instructions (Signed)
Bunionectomy, Care After Refer to this sheet in the next few weeks. These instructions provide you with information about caring for yourself after your procedure. Your health care provider may also give you more specific instructions. Your treatment has been planned according to current medical practices, but problems sometimes occur. Call your health care provider if you have any problems or questions after your procedure. What can I expect after the procedure? After your procedure, it is typical to have the following:  Pain.  Swelling. Follow these instructions at home:  Take medicines only as directed by your health care provider.  Apply ice to the affected area:  Put ice in a plastic bag.  Place a towel between your skin and the bag.  Leave the ice on for 20 minutes, 2-3 times a day.  There are many different ways to close and cover an incision, including stitches (sutures), skin glue, and adhesive strips. Follow your health care provider's instructions about:  Incision care.  Bandage (dressing) changes and removal.  Incision closure removal.  Keep your foot raised (elevated) while you are resting.  You may need to wear a brace or a boot that keeps your foot in the proper position. Wear the boot or brace as directed by your health care provider.  Use crutches, canes, or walkers as directed by your health care provider.  Do not put weight on your foot until your health care provider approves.  Rest as needed. Follow your health care provider's instructions on when you can return to your usual activities, like walking and driving.  Do not wear high heels or tight-fitting shoes.  Do physical therapy exercises to strengthen your foot as directed by your health care provider. Contact a health care provider if:  You have increased bleeding from the incision.  You have drainage, redness, swelling, or pain at your incision site.  You have a fever or chills.  You notice a  bad smell coming from the incision or the dressing.  Your dressing gets wet or falls off.  Your calf begins to swell.  Your toes feel numb or stiff. Get help right away if:  You have a rash.  You have difficulty breathing. This information is not intended to replace advice given to you by your health care provider. Make sure you discuss any questions you have with your health care provider. Document Released: 12/17/2004 Document Revised: 11/05/2015 Document Reviewed: 01/15/2014 Elsevier Interactive Patient Education  2017 [employer].

## 2016-10-10 NOTE — Progress Notes (Signed)
Subjective: Fred Carroll presents to the office today for follow-up evaluation of left heel pain. They state that they are doing better and not having any pain at this time. They have been icing, stretching, try to wear supportive shoe as much as possible. No other complaints at this time. No acute changes since last appointment. They deny any systemic complaints such as fevers, chills, nausea, vomiting.  Objective: General: AAO x3, NAD  Dermatological: Skin is warm, dry and supple bilateral. Nails x 10 are well manicured; remaining integument appears unremarkable at this time. There are no open sores, no preulcerative lesions, no rash or signs of infection present.  Vascular: Dorsalis Pedis artery and Posterior Tibial artery pedal pulses are 2/4 bilateral with immedate capillary fill time. Pedal hair growth present. There is no pain with calf compression, swelling, warmth, erythema.   Neruologic: Grossly intact via light touch bilateral. Vibratory intact via tuning fork bilateral. Protective threshold with Semmes Wienstein monofilament intact to all pedal sites bilateral.   Musculoskeletal: There is no signficiant tenderness to palpation along the plantar medial tubercle of the calcaneus at the insertion of the plantar fascia on the left foot. There is no pain along the course of the plantar fascia within the arch of the foot. Plantar fascia appears to be intact bilaterally. There is no pain with lateral compression of the calcaneus and there is no pain with vibratory sensation. There is no pain along the course or insertion of the Achilles tendon. There are no other areas of tenderness to bilateral lower extremities. No gross boney pedal deformities bilateral. No pain, crepitus, or limitation noted with foot and ankle range of motion bilateral. Muscular strength 5/5 in all groups tested bilateral.  Gait: Unassisted, Nonantalgic.   Assessment: Presents for follow-up evaluation for heel  pain, likely plantar fasciitis which is much improved.   Plan: -Treatment options discussed including all alternatives, risks, and complications -At this time the wound is much improved. Discussed with the measures to help prevent recurrence again. Continue with stretching, icing exercises daily discussed supportive shoe gear and orthotics. -Follow-up as needed. In the meantime, encouraged to call the office with any questions, concerns, change in symptoms.   Celesta Gentile, DPM

## 2016-12-23 ENCOUNTER — Ambulatory Visit (INDEPENDENT_AMBULATORY_CARE_PROVIDER_SITE_OTHER): Payer: BLUE CROSS/BLUE SHIELD | Admitting: Physician Assistant

## 2016-12-23 ENCOUNTER — Encounter: Payer: Self-pay | Admitting: Physician Assistant

## 2016-12-23 VITALS — BP 161/99 | HR 71 | Temp 98.1°F | Resp 18 | Ht 67.0 in | Wt 190.6 lb

## 2016-12-23 DIAGNOSIS — M62838 Other muscle spasm: Secondary | ICD-10-CM | POA: Diagnosis not present

## 2016-12-23 DIAGNOSIS — M542 Cervicalgia: Secondary | ICD-10-CM | POA: Diagnosis not present

## 2016-12-23 MED ORDER — CYCLOBENZAPRINE HCL 10 MG PO TABS: 10.0000 mg | ORAL_TABLET | Freq: Three times a day (TID) | ORAL | 0 refills | Status: DC | PRN

## 2016-12-23 MED ORDER — MELOXICAM 15 MG PO TABS: 15.0000 mg | ORAL_TABLET | Freq: Every day | ORAL | 2 refills | Status: AC

## 2016-12-23 NOTE — Progress Notes (Signed)
   Fred Carroll  MRN: [id number] DOB: 1955-07-23  PCP: Sharilyn Sites, MD  Subjective:  Pt is a right handed 61 year old male who presents to clinic for neck and arm pain. He was at work yesterday when pain started. Located on the left side of his neck. 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 Pain is worse when he tilts head back to gargle. Yesterday he could not look over his right shoulder due to neck pain. He applied pain patches to the area - this is helping some.  Denies n/t, muscle weakness, neck stiffness, n/v, chest pain, chest pressure, palpitations.    Review of Systems  Constitutional: Negative for chills and diaphoresis.  Respiratory: Negative for cough, chest tightness, shortness of breath and wheezing.   Cardiovascular: Negative for chest pain, palpitations and leg swelling.  Gastrointestinal: Negative for nausea.  Musculoskeletal: Positive for myalgias (left shoulder) and neck pain.  Neurological: Negative for dizziness, syncope, light-headedness and headaches.  Psychiatric/Behavioral: Negative for sleep disturbance. The patient is not nervous/anxious.     Patient Active Problem List   Diagnosis Date Noted  . Special screening for malignant neoplasms, colon   . Plantar fasciitis of left foot 05/05/2014    Current Outpatient Prescriptions on File Prior to Visit  Medication Sig Dispense Refill  . losartan-hydrochlorothiazide (HYZAAR) 100-12.5 MG per tablet Take 1 tablet by mouth daily.    . simvastatin (ZOCOR) 20 MG tablet Take 20 mg by mouth daily.    . meloxicam (MOBIC) 15 MG tablet Take 1 tablet (15 mg total) by mouth daily. (Patient not taking: Reported on 12/23/2016) 30 tablet 2   No current facility-administered medications on file prior to visit.     No Known Allergies   Objective:  BP (!) 162/105 (BP Location: Right Arm, Patient Position: Sitting, Cuff Size: Normal)   Pulse 71   Temp 98.1 F (36.7 C) (Oral)   Resp 18   Ht 5\' 7"  (1.702 m)    Wt 190 lb 9.6 oz (86.5 kg)   SpO2 96%   BMI 29.85 kg/m   Physical Exam  Constitutional: He is oriented to person, place, and time and well-developed, well-nourished, and in no distress. No distress.  Cardiovascular: Normal rate, regular rhythm and normal heart sounds.   Musculoskeletal:       Left shoulder: He exhibits tenderness and spasm. He exhibits normal range of motion, no bony tenderness, no swelling and normal strength.       Cervical back: He exhibits spasm. He exhibits normal range of motion, no tenderness and no bony tenderness.  Neurological: He is alert and oriented to person, place, and time. GCS score is 15.  Skin: Skin is warm and dry.  Psychiatric: Mood, memory, affect and judgment normal.  Vitals reviewed.   Assessment and Plan :  1. Neck pain 2. Muscle spasm of left shoulder - cyclobenzaprine (FLEXERIL) 10 MG tablet; Take 1 tablet (10 mg total) by mouth 3 (three) times daily as needed for muscle spasms.  Dispense: 30 tablet; Refill: 0 - meloxicam (MOBIC) 15 MG tablet; Take 1 tablet (15 mg total) by mouth daily.  Dispense: 30 tablet; Refill: 2 - Suspect pain is musculoskeletal in origin due to repetitive motions at his workplace.. Advised heat, stretches, massage, hydration. Stretches demonstrated to pt. RTC in 3-4 weeks if no improvement. He agrees with plan.   Mercer Pod, PA-C  [facility] 12/23/2016 11:32 AM

## 2016-12-23 NOTE — Patient Instructions (Addendum)
  Apply heat to your shoulder 20-30 min at a time for 2-4 times a day.  Stay well hydrated - drink 2-4 liters of water daily.  Stretch your neck several times a day.  Come back if you are not better in 2-3 weeks.   Thank you for coming in today. I hope you feel we met your needs.  Feel free to call [facility] if you have any questions or further requests.  Please consider signing up for MyChart if you do not already have it, as this is a great way to communicate with me.  Best,  Whitney McVey, PA-C  IF you received an x-ray today, you will receive an invoice from [facility]. Please contact [facility] at [phone] with questions or concerns regarding your invoice.   IF you received labwork today, you will receive an invoice from [facility]. Please contact [facility] at [phone] with questions or concerns regarding your invoice.   Our billing staff will not be able to assist you with questions regarding bills from these companies.  You will be contacted with the lab results as soon as they are available. The fastest way to get your results is to activate your My Chart account. Instructions are located on the last page of this paperwork. If you have not heard from [place] regarding the results in 2 weeks, please contact this office.

## 2017-12-28 ENCOUNTER — Other Ambulatory Visit (HOSPITAL_COMMUNITY): Payer: Self-pay | Admitting: Family Medicine

## 2017-12-28 DIAGNOSIS — R945 Abnormal results of liver function studies: Secondary | ICD-10-CM

## 2018-01-02 ENCOUNTER — Ambulatory Visit (HOSPITAL_COMMUNITY)
Admission: RE | Admit: 2018-01-02 | Discharge: 2018-01-02 | Disposition: A | Discharge status: Home / Self Care | Payer: BLUE CROSS/BLUE SHIELD | Source: Ambulatory Visit | Attending: Family Medicine | Admitting: Family Medicine

## 2018-01-02 ENCOUNTER — Other Ambulatory Visit (HOSPITAL_COMMUNITY): Payer: Self-pay | Admitting: Family Medicine

## 2018-01-02 DIAGNOSIS — R945 Abnormal results of liver function studies: Secondary | ICD-10-CM | POA: Insufficient documentation

## 2018-01-02 DIAGNOSIS — R7989 Other specified abnormal findings of blood chemistry: Secondary | ICD-10-CM

## 2018-10-17 ENCOUNTER — Encounter: Payer: Self-pay | Admitting: Gastroenterology

## 2019-02-19 ENCOUNTER — Ambulatory Visit: Payer: BLUE CROSS/BLUE SHIELD | Admitting: Cardiology

## 2019-11-15 NOTE — Patient Instructions (Addendum)
DUE TO COVID-19 ONLY ONE VISITOR IS ALLOWED TO COME WITH YOU AND STAY IN THE WAITING ROOM ONLY DURING PRE OP AND PROCEDURE DAY OF SURGERY. TWo VISITOR MAY VISIT WITH YOU AFTER SURGERY IN YOUR PRIVATE ROOM DURING VISITING HOURS ONLY! 10a-8p  YOU NEED TO HAVE A COVID 19 TEST ON_6-19-21______ @_10 :00______, THIS TEST MUST BE DONE BEFORE SURGERY, COME  [address], [place]  , [address].  ([place]) ONCE YOUR COVID TEST IS COMPLETED, PLEASE BEGIN THE QUARANTINE INSTRUCTIONS AS OUTLINED IN YOUR HANDOUT.                Fred Carroll  11/15/2019   Your procedure is scheduled on: 12-04-19   Report to [facility] Main  Entrance   Report to short stay  at        0530  AM     Call this number if you have problems the morning of surgery [phone]    Remember: NO SOLID FOOD AFTER MIDNIGHT THE NIGHT PRIOR TO SURGERY. NOTHING BY MOUTH EXCEPT CLEAR LIQUIDS UNTIL   0430 am  . PLEASE FINISH ENSURE DRINK PER SURGEON ORDER  WHICH NEEDS TO BE COMPLETED AT      0430 then nothing by mouth .    CLEAR LIQUID DIET   Foods Allowed                                                                                        Foods Excluded  Coffee and tea, regular and decaf   NO creamer                              liquids that you cannot  Plain Jell-O any favor except red or purple                                           see through such as: Fruit ices (not with fruit pulp)                                                                milk, soups, orange juice  Iced Popsicles                                                                  All solid food Carbonated beverages, regular and diet                                    Cranberry, grape  and apple juices Sports drinks like Gatorade Lightly seasoned clear broth or consume(fat free) Sugar, honey syrup  ____________________________________________________________________    BRUSH YOUR TEETH MORNING OF SURGERY AND  RINSE YOUR MOUTH OUT, NO CHEWING GUM CANDY OR MINTS.     Take these medicines the morning of surgery with A SIP OF WATER: eyedrops as usual                                 You may not have any metal on your body including hair pins and              piercings  Do not wear jewelry, , lotions, powders or perfumes, deodorant                Men may shave face and neck.   Do not bring valuables to the hospital. [place].  Contacts, dentures or bridgework may not be worn into surgery.      Patients discharged the day of surgery will not be allowed to drive home. IF YOU ARE HAVING SURGERY AND GOING HOME THE SAME DAY, YOU MUST HAVE AN ADULT TO DRIVE YOU HOME AND BE WITH YOU FOR 24 HOURS. YOU MAY GO HOME BY TAXI OR UBER OR ORTHERWISE, BUT AN ADULT MUST ACCOMPANY YOU HOME AND STAY WITH YOU FOR 24 HOURS.  Name and phone number of your driver:  Special Instructions: N/A              Please read over the following fact sheets you were given: _____________________________________________________________________             [facility] - Preparing for Surgery Before surgery, you can play an important role.  Because skin is not sterile, your skin needs to be as free of germs as possible.  You can reduce the number of germs on your skin by washing with CHG (chlorahexidine gluconate) soap before surgery.  CHG is an antiseptic cleaner which kills germs and bonds with the skin to continue killing germs even after washing. Please DO NOT use if you have an allergy to CHG or antibacterial soaps.  If your skin becomes reddened/irritated stop using the CHG and inform your nurse when you arrive at Short Stay. Do not shave (including legs and underarms) for at least 48 hours prior to the first CHG shower.  You may shave your face/neck. Please follow these instructions carefully:  1.  Shower with CHG Soap the night before surgery and the  morning of Surgery.  2.   If you choose to wash your hair, wash your hair first as usual with your  normal  shampoo.  3.  After you shampoo, rinse your hair and body thoroughly to remove the  shampoo.                           4.  Use CHG as you would any other liquid soap.  You can apply chg directly  to the skin and wash                       Gently with a scrungie or clean washcloth.  5.  Apply the CHG Soap to your body ONLY FROM THE NECK DOWN.   Do not use on face/  open                           Wound or open sores. Avoid contact with eyes, ears mouth and genitals (private parts).                       Wash face,  Genitals (private parts) with your normal soap.             6.  Wash thoroughly, paying special attention to the area where your surgery  will be performed.  7.  Thoroughly rinse your body with warm water from the neck down.  8.  DO NOT shower/wash with your normal soap after using and rinsing off  the CHG Soap.                9.  Pat yourself dry with a clean towel.            10.  Wear clean pajamas.            11.  Place clean sheets on your bed the night of your first shower and do not  sleep with pets. Day of Surgery : Do not apply any lotions/deodorants the morning of surgery.  Please wear clean clothes to the hospital/surgery center.  FAILURE TO FOLLOW THESE INSTRUCTIONS MAY RESULT IN THE CANCELLATION OF YOUR SURGERY PATIENT SIGNATURE_________________________________  NURSE SIGNATURE__________________________________  ________________________________________________________________________   Fred Carroll  An incentive spirometer is a tool that can help keep your lungs clear and active. This tool measures how well you are filling your lungs with each breath. Taking long deep breaths may help reverse or decrease the chance of developing breathing (pulmonary) problems (especially infection) following:  A long period of time when you are unable to move or be active. BEFORE THE PROCEDURE    If the spirometer includes an indicator to show your best effort, your nurse or respiratory therapist will set it to a desired goal.  If possible, sit up straight or lean slightly forward. Try not to slouch.  Hold the incentive spirometer in an upright position. INSTRUCTIONS FOR USE  1. Sit on the edge of your bed if possible, or sit up as far as you can in bed or on a chair. 2. Hold the incentive spirometer in an upright position. 3. Breathe out normally. 4. Place the mouthpiece in your mouth and seal your lips tightly around it. 5. Breathe in slowly and as deeply as possible, raising the piston or the ball toward the top of the column. 6. Hold your breath for 3-5 seconds or for as long as possible. Allow the piston or ball to fall to the bottom of the column. 7. Remove the mouthpiece from your mouth and breathe out normally. 8. Rest for a few seconds and repeat Steps 1 through 7 at least 10 times every 1-2 hours when you are awake. Take your time and take a few normal breaths between deep breaths. 9. The spirometer may include an indicator to show your best effort. Use the indicator as a goal to work toward during each repetition. 10. After each set of 10 deep breaths, practice coughing to be sure your lungs are clear. If you have an incision (the cut made at the time of surgery), support your incision when coughing by placing a pillow or rolled up towels firmly against it. Once you are able to get out of bed, walk around indoors and  cough well. You may stop using the incentive spirometer when instructed by your caregiver.  RISKS AND COMPLICATIONS  Take your time so you do not get dizzy or light-headed.  If you are in pain, you may need to take or ask for pain medication before doing incentive spirometry. It is harder to take a deep breath if you are having pain. AFTER USE  Rest and breathe slowly and easily.  It can be helpful to keep track of a log of your progress. Your caregiver  can provide you with a simple table to help with this. If you are using the spirometer at home, follow these instructions: [place] IF:   You are having difficultly using the spirometer.  You have trouble using the spirometer as often as instructed.  Your pain medication is not giving enough relief while using the spirometer.  You develop fever of 100.5 F (38.1 C) or higher. SEEK IMMEDIATE MEDICAL CARE IF:   You cough up bloody sputum that had not been present before.  You develop fever of 102 F (38.9 C) or greater.  You develop worsening pain at or near the incision site. MAKE SURE YOU:   Understand these instructions.  Will watch your condition.  Will get help right away if you are not doing well or get worse. Document Released: 10/10/2006 Document Revised: 08/22/2011 Document Reviewed: 12/11/2006 [facility] Patient Information 2014 [place], [place].   ________________________________________________________________________

## 2019-11-18 ENCOUNTER — Encounter (HOSPITAL_COMMUNITY)
Admission: RE | Admit: 2019-11-18 | Discharge: 2019-11-18 | Disposition: A | Discharge status: Home / Self Care | Payer: 59 | Source: Ambulatory Visit | Attending: Orthopedic Surgery | Admitting: Orthopedic Surgery

## 2019-11-18 ENCOUNTER — Encounter (HOSPITAL_COMMUNITY): Payer: Self-pay

## 2019-11-18 ENCOUNTER — Ambulatory Visit: Payer: Self-pay | Admitting: Orthopedic Surgery

## 2019-11-18 ENCOUNTER — Other Ambulatory Visit: Payer: Self-pay

## 2019-11-18 DIAGNOSIS — Z01818 Encounter for other preprocedural examination: Secondary | ICD-10-CM | POA: Diagnosis present

## 2019-11-18 HISTORY — DX: Malignant (primary) neoplasm, unspecified: C80.1

## 2019-11-18 HISTORY — DX: Unspecified osteoarthritis, unspecified site: M19.90

## 2019-11-18 HISTORY — DX: Dry eye syndrome of unspecified lacrimal gland: H04.129

## 2019-11-18 HISTORY — DX: Personal history of urinary calculi: Z87.442

## 2019-11-18 HISTORY — DX: Unspecified amblyopia, unspecified eye: H53.009

## 2019-11-21 NOTE — Progress Notes (Signed)
PCP - Sharilyn Sites MD Cardiologist -   PPM/ICD -  Device Orders -  Rep Notified -   Chest x-ray -  EKG -  Stress Test -  ECHO -  Cardiac Cath -   Sleep Study -  CPAP -   Fasting Blood Sugar -  Checks Blood Sugar _____ times a day  Blood Thinner Instructions: Aspirin Instructions:  ERAS Protcol - PRE-SURGERY Ensure or G2-   COVID TEST-    Anesthesia review:   Patient denies shortness of breath, fever, cough and chest pain at PAT appointment  none   All instructions explained to the patient, with a verbal understanding of the material. Patient agrees to go over the instructions while at home for a better understanding. Patient also instructed to self quarantine after being tested for COVID-19. The opportunity to ask questions was provided.

## 2019-11-25 ENCOUNTER — Other Ambulatory Visit: Payer: Self-pay

## 2019-11-25 ENCOUNTER — Encounter (HOSPITAL_COMMUNITY)
Admission: RE | Admit: 2019-11-25 | Discharge: 2019-11-25 | Disposition: A | Discharge status: Home / Self Care | Payer: 59 | Source: Ambulatory Visit | Attending: Orthopedic Surgery | Admitting: Orthopedic Surgery

## 2019-11-25 ENCOUNTER — Ambulatory Visit: Payer: Self-pay | Admitting: Orthopedic Surgery

## 2019-11-25 DIAGNOSIS — Z01818 Encounter for other preprocedural examination: Secondary | ICD-10-CM | POA: Diagnosis not present

## 2019-11-25 LAB — COMPREHENSIVE METABOLIC PANEL
ALT: 25 U/L (ref 0–44)
AST: 61 U/L — ABNORMAL HIGH (ref 15–41)
Albumin: 4.4 g/dL (ref 3.5–5.0)
Alkaline Phosphatase: 61 U/L (ref 38–126)
Anion gap: 10 (ref 5–15)
BUN: 13 mg/dL (ref 8–23)
CO2: 24 mmol/L (ref 22–32)
Calcium: 8.9 mg/dL (ref 8.9–10.3)
Chloride: 105 mmol/L (ref 98–111)
Creatinine, Ser: 1.66 mg/dL — ABNORMAL HIGH (ref 0.61–1.24)
GFR calc Af Amer: 50 mL/min — ABNORMAL LOW (ref >60)
GFR calc non Af Amer: 43 mL/min — ABNORMAL LOW (ref >60)
Glucose, Bld: 89 mg/dL (ref 70–99)
Potassium: 6.2 mmol/L — ABNORMAL HIGH (ref 3.5–5.1)
Sodium: 139 mmol/L (ref 135–145)
Total Bilirubin: 2.4 mg/dL — ABNORMAL HIGH (ref 0.3–1.2)
Total Protein: 7.3 g/dL (ref 6.5–8.1)

## 2019-11-25 LAB — URINALYSIS, ROUTINE W REFLEX MICROSCOPIC
Bilirubin Urine: NEGATIVE
Glucose, UA: NEGATIVE mg/dL
Hgb urine dipstick: NEGATIVE
Ketones, ur: NEGATIVE mg/dL
Leukocytes,Ua: NEGATIVE
Nitrite: NEGATIVE
Protein, ur: NEGATIVE mg/dL
Specific Gravity, Urine: 1.019 (ref 1.005–1.030)
pH: 5 (ref 5.0–8.0)

## 2019-11-25 LAB — CBC
HCT: 47.3 % (ref 39.0–52.0)
Hemoglobin: 14.5 g/dL (ref 13.0–17.0)
MCH: 22 pg — ABNORMAL LOW (ref 26.0–34.0)
MCHC: 30.7 g/dL (ref 30.0–36.0)
MCV: 71.7 fL — ABNORMAL LOW (ref 80.0–100.0)
Platelets: 169 10*3/uL (ref 150–400)
RBC: 6.6 MIL/uL — ABNORMAL HIGH (ref 4.22–5.81)
RDW: 18.5 % — ABNORMAL HIGH (ref 11.5–15.5)
WBC: 6.4 10*3/uL (ref 4.0–10.5)
nRBC: 0 % (ref 0.0–0.2)

## 2019-11-25 LAB — PROTIME-INR
INR: 1 (ref 0.8–1.2)
Prothrombin Time: 12.9 seconds (ref 11.4–15.2)

## 2019-11-25 LAB — ABO/RH: ABO/RH(D): O POS

## 2019-11-25 LAB — SURGICAL PCR SCREEN
MRSA, PCR: NEGATIVE
Staphylococcus aureus: NEGATIVE

## 2019-11-30 ENCOUNTER — Other Ambulatory Visit (HOSPITAL_COMMUNITY): Payer: BLUE CROSS/BLUE SHIELD

## 2019-12-04 ENCOUNTER — Ambulatory Visit (HOSPITAL_COMMUNITY): Admission: RE | Admit: 2019-12-04 | Payer: 59 | Source: Ambulatory Visit | Admitting: Orthopedic Surgery

## 2019-12-04 ENCOUNTER — Encounter (HOSPITAL_COMMUNITY): Admission: RE | Payer: Self-pay | Source: Ambulatory Visit

## 2019-12-04 LAB — TYPE AND SCREEN
ABO/RH(D): O POS
Antibody Screen: NEGATIVE

## 2019-12-04 SURGERY — ARTHROPLASTY, HIP, TOTAL, ANTERIOR APPROACH
Anesthesia: Spinal | Site: Hip | Laterality: Left

## 2019-12-10 IMAGING — US US ABDOMEN COMPLETE
1 series · 13 of 25 positions shown · non-contrast
Comparison: None.

CLINICAL DATA: Abnormal liver function tests.

EXAM:
ABDOMEN ULTRASOUND COMPLETE

[Series 1: us abdomen complete · 0.25mm/px · 13 of 90 slices shown]
[im 1/90]
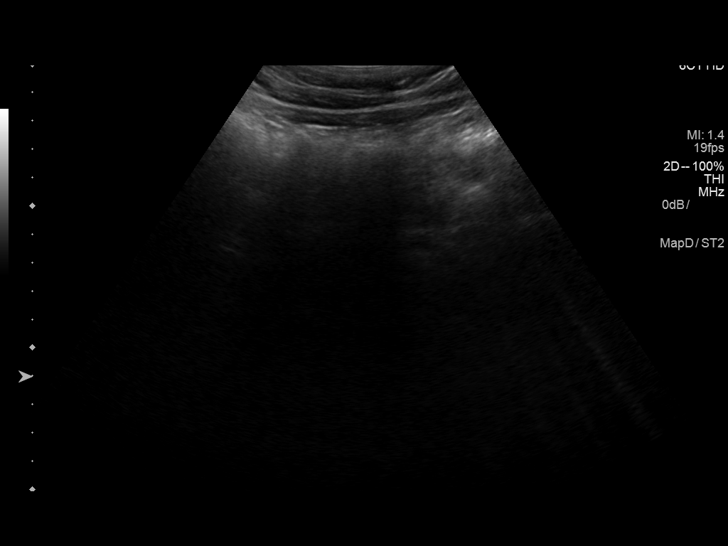
[im 8/90]
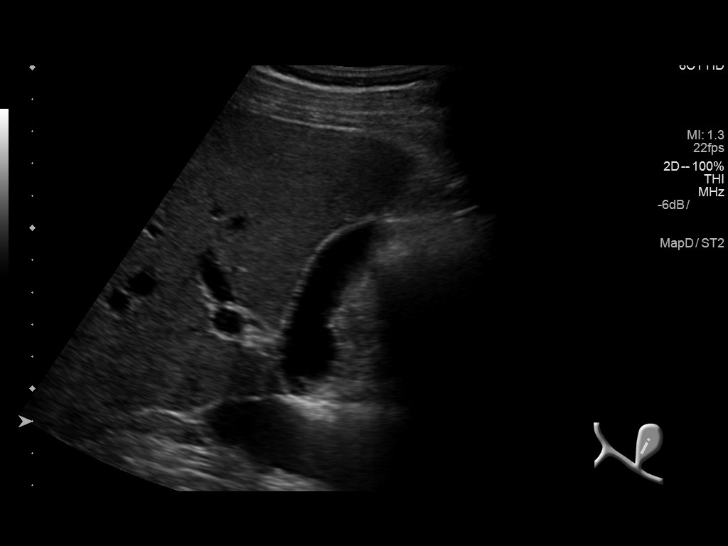
[im 15/90]
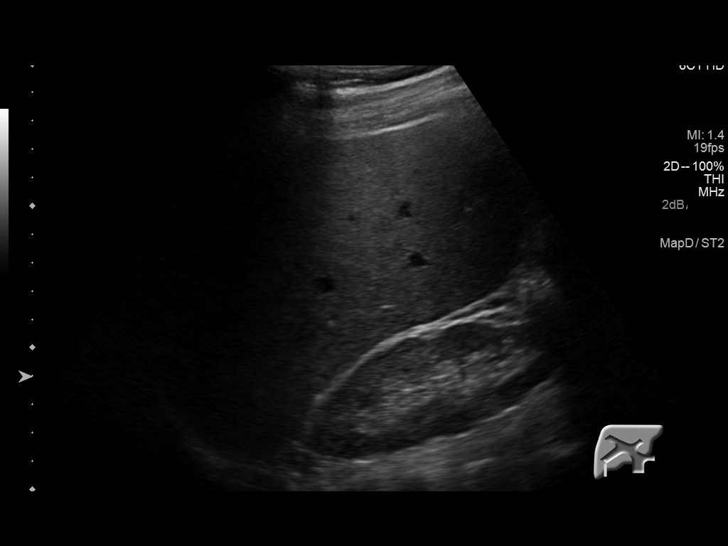
[im 23/90]
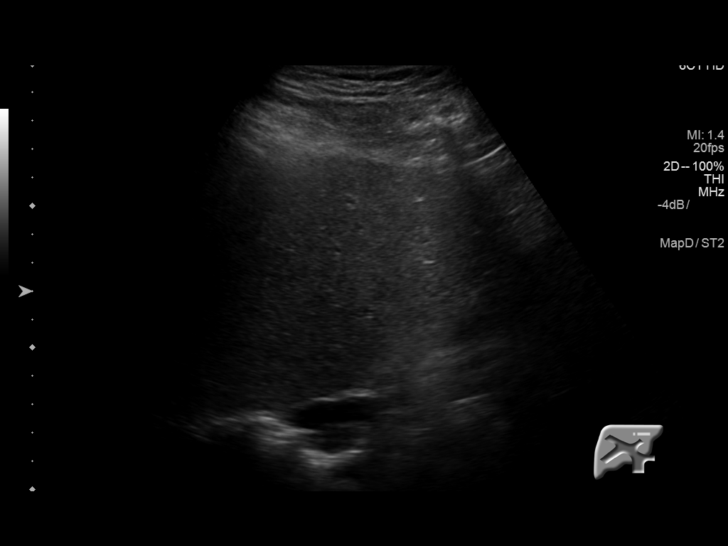
[im 30/90]
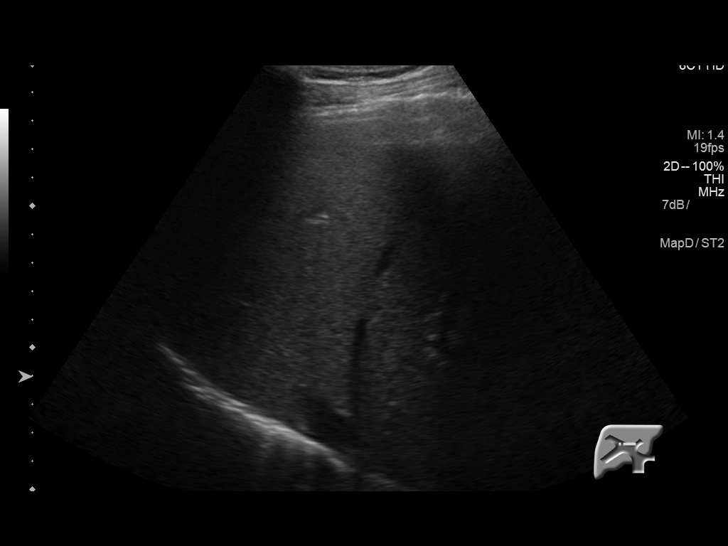
[im 38/90]
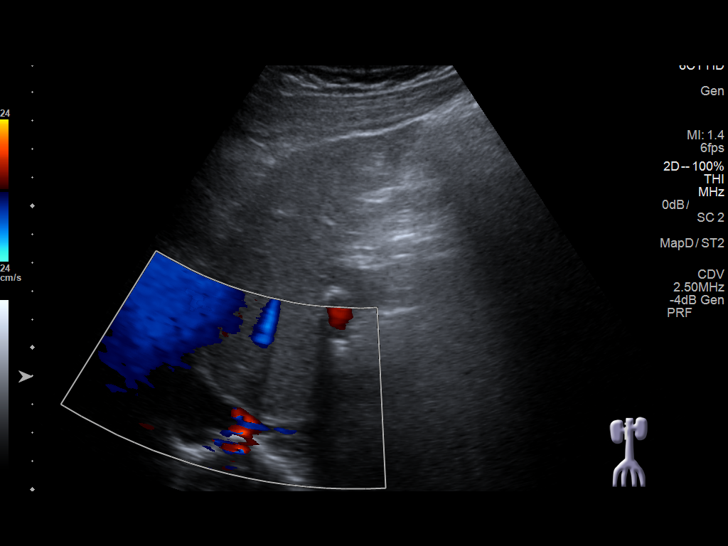
[im 45/90]
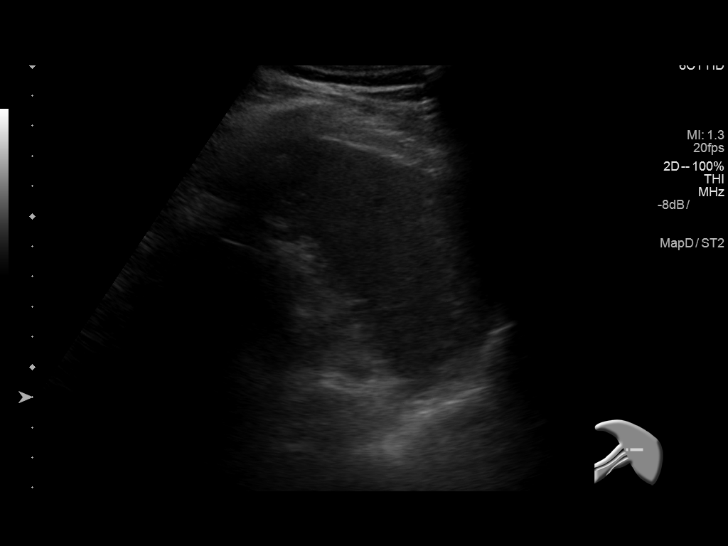
[im 52/90]
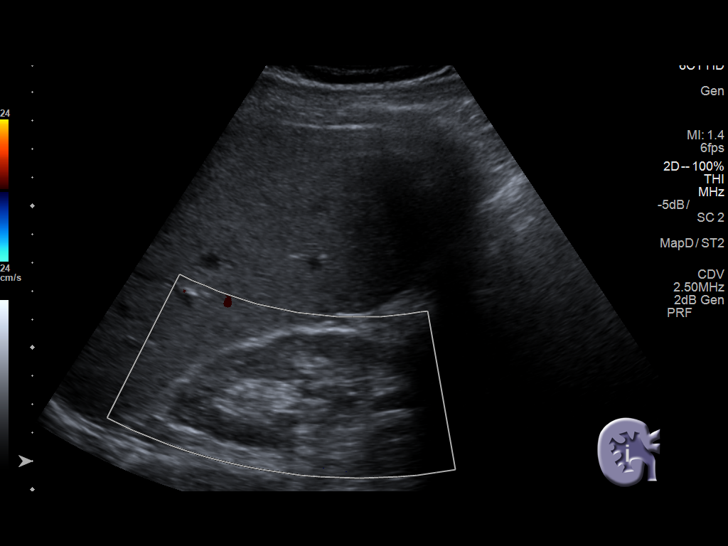
[im 60/90]
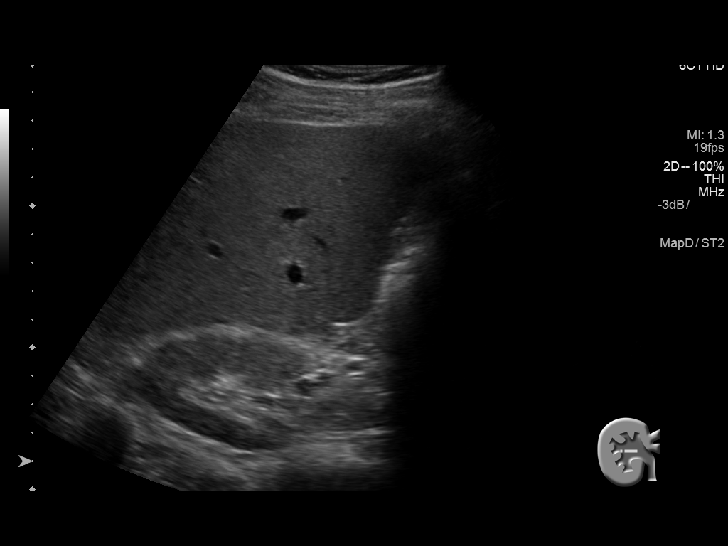
[im 67/90]
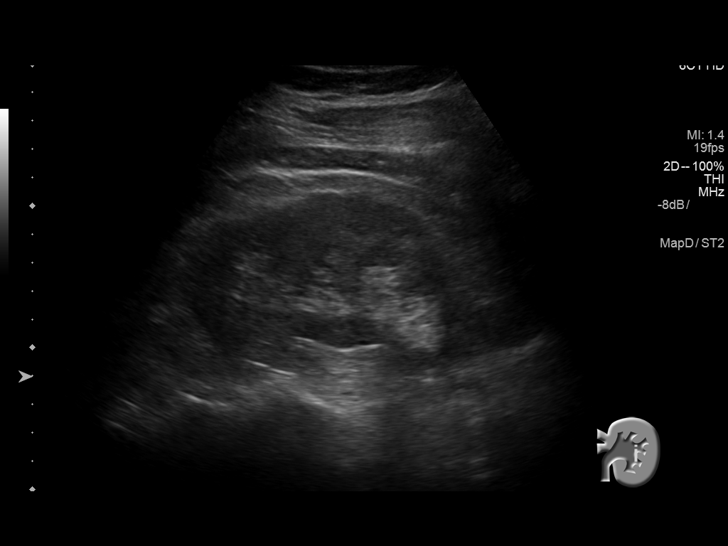
[im 75/90]
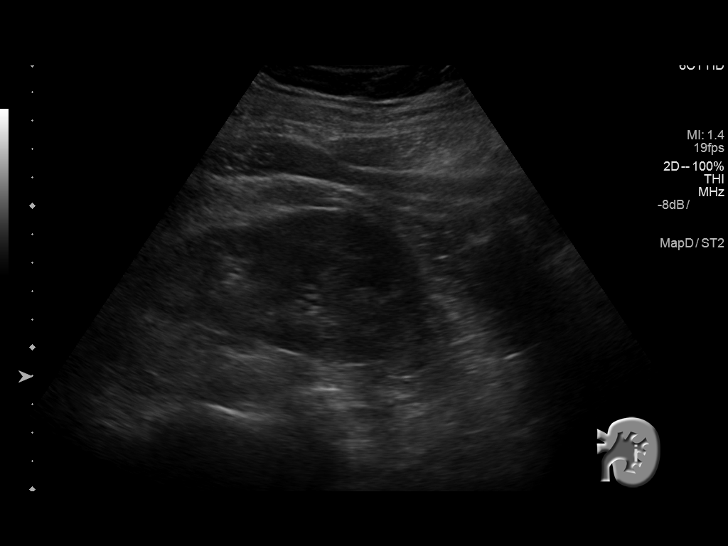
[im 82/90]
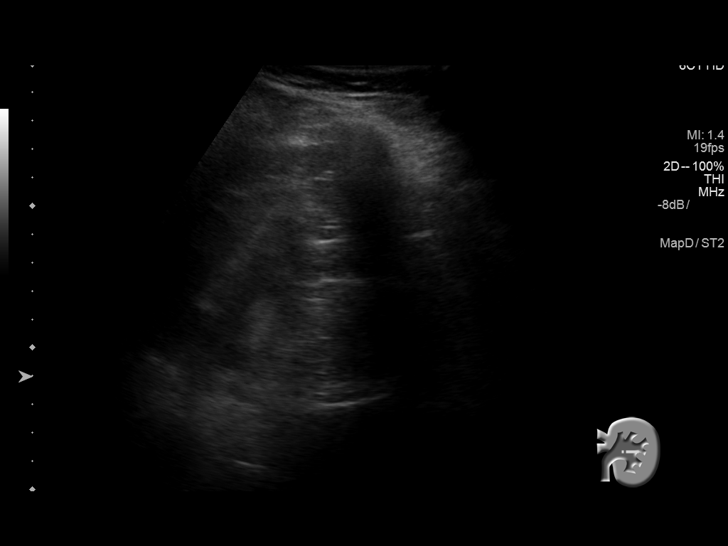
[im 90/90]
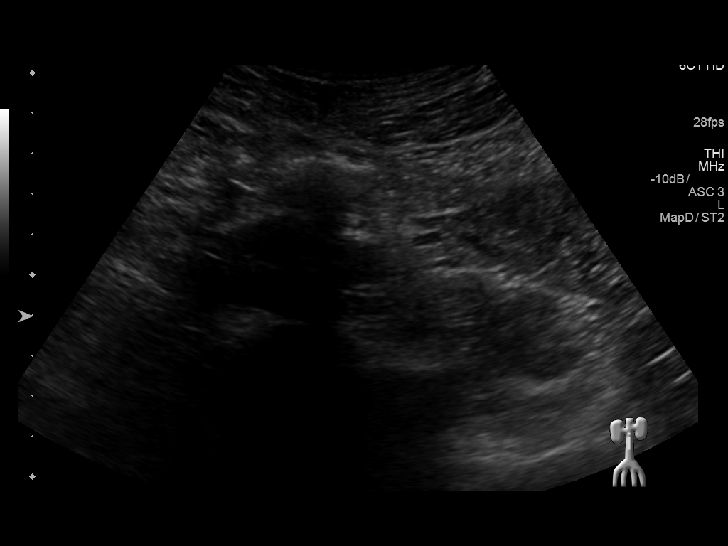

[13 of 25 positions shown; findings below may reference images not displayed]

FINDINGS: Gallbladder: No gallstones or wall thickening visualized. No
sonographic Murphy sign noted by sonographer.

Common bile duct: Diameter: Normal caliber 3 mm.

Liver: The liver demonstrates mildly increased echogenicity, likely
reflecting diffuse steatosis. No overt cirrhotic contour
abnormalities or focal lesions are identified. There is no evidence
of intrahepatic biliary ductal dilatation. The portal vein is open.
Portal vein is patent on color Doppler imaging with normal direction
of blood flow towards the liver.

IVC: No abnormality visualized.

Pancreas: Visualized portion unremarkable.

Spleen: Size and appearance within normal limits.

Right Kidney: Length: 10.5 cm. Echogenicity within normal limits.
Tiny simple cyst of the upper pole has a benign appearance and
measures 7 mm. No solid mass or hydronephrosis visualized.

Left Kidney: Length: 11.6 cm. Echogenicity within normal limits.
Simple cyst of the upper left kidney measures 1.1 cm and has a
benign appearance. No solid mass or hydronephrosis visualized.

Abdominal aorta: No aneurysm visualized.

Other findings: None.
IMPRESSION: Mildly increased echogenicity of the hepatic parenchyma is likely
consistent with steatosis. No overt evidence of cirrhosis or biliary
obstruction by ultrasound.

## 2020-01-21 ENCOUNTER — Encounter (INDEPENDENT_AMBULATORY_CARE_PROVIDER_SITE_OTHER): Payer: BLUE CROSS/BLUE SHIELD | Admitting: Ophthalmology

## 2020-01-24 ENCOUNTER — Other Ambulatory Visit: Payer: Self-pay

## 2020-01-24 ENCOUNTER — Encounter (INDEPENDENT_AMBULATORY_CARE_PROVIDER_SITE_OTHER): Payer: 59 | Admitting: Ophthalmology

## 2020-01-24 DIAGNOSIS — H35033 Hypertensive retinopathy, bilateral: Secondary | ICD-10-CM | POA: Diagnosis not present

## 2020-01-24 DIAGNOSIS — H2513 Age-related nuclear cataract, bilateral: Secondary | ICD-10-CM

## 2020-01-24 DIAGNOSIS — H43813 Vitreous degeneration, bilateral: Secondary | ICD-10-CM | POA: Diagnosis not present

## 2020-01-24 DIAGNOSIS — I10 Essential (primary) hypertension: Secondary | ICD-10-CM | POA: Diagnosis not present

## 2020-01-24 DIAGNOSIS — H3411 Central retinal artery occlusion, right eye: Secondary | ICD-10-CM

## 2020-02-28 ENCOUNTER — Encounter (INDEPENDENT_AMBULATORY_CARE_PROVIDER_SITE_OTHER): Payer: 59 | Admitting: Ophthalmology

## 2020-02-28 ENCOUNTER — Other Ambulatory Visit: Payer: Self-pay

## 2020-02-28 DIAGNOSIS — I10 Essential (primary) hypertension: Secondary | ICD-10-CM

## 2020-02-28 DIAGNOSIS — H4311 Vitreous hemorrhage, right eye: Secondary | ICD-10-CM | POA: Diagnosis not present

## 2020-02-28 DIAGNOSIS — H35033 Hypertensive retinopathy, bilateral: Secondary | ICD-10-CM | POA: Diagnosis not present

## 2020-02-28 DIAGNOSIS — H3411 Central retinal artery occlusion, right eye: Secondary | ICD-10-CM

## 2020-02-28 DIAGNOSIS — H43813 Vitreous degeneration, bilateral: Secondary | ICD-10-CM

## 2020-03-04 ENCOUNTER — Other Ambulatory Visit: Payer: Self-pay

## 2020-03-04 ENCOUNTER — Encounter (INDEPENDENT_AMBULATORY_CARE_PROVIDER_SITE_OTHER): Payer: 59 | Admitting: Ophthalmology

## 2020-03-04 DIAGNOSIS — H35033 Hypertensive retinopathy, bilateral: Secondary | ICD-10-CM | POA: Diagnosis not present

## 2020-03-04 DIAGNOSIS — I10 Essential (primary) hypertension: Secondary | ICD-10-CM

## 2020-03-04 DIAGNOSIS — H3411 Central retinal artery occlusion, right eye: Secondary | ICD-10-CM

## 2020-03-17 ENCOUNTER — Encounter: Payer: Self-pay | Admitting: *Deleted

## 2020-04-28 ENCOUNTER — Other Ambulatory Visit: Payer: Self-pay

## 2020-04-28 ENCOUNTER — Ambulatory Visit (INDEPENDENT_AMBULATORY_CARE_PROVIDER_SITE_OTHER): Payer: Self-pay | Admitting: *Deleted

## 2020-04-28 VITALS — Ht 67.0 in | Wt 185.6 lb

## 2020-04-28 DIAGNOSIS — Z86010 Personal history of colonic polyps: Secondary | ICD-10-CM

## 2020-04-28 MED ORDER — NA SULFATE-K SULFATE-MG SULF 17.5-3.13-1.6 GM/177ML PO SOLN: 1.0000 | Freq: Once | ORAL | 0 refills | Status: AC

## 2020-04-28 NOTE — Patient Instructions (Signed)
Fred Carroll  [birth year] MRN: [id number]     Procedure Date: 05/25/2020 Time to register: 6:45 am Place to register: [facility] Procedure Time: 8:15 am Scheduled provider: Dr. Abbey Chatters    PREPARATION FOR COLONOSCOPY WITH SUPREP BOWEL PREP KIT  Note: Suprep Bowel Prep Kit is a split-dose (2day) regimen. Consumption of BOTH 6-ounce bottles is required for a complete prep.  Please notify us immediately if you are diabetic, take iron supplements, or if you are on Coumadin or any other blood thinners.  Please hold the following medications: n/a                                                                                                                                                  2 DAYS BEFORE PROCEDURE:  DATE: 05/23/2020  DAY: Saturday Begin clear liquid diet AFTER your lunch meal. NO SOLID FOODS after this point.  1 DAY BEFORE PROCEDURE:  DATE: 05/24/2020   DAY: Sunday Continue clear liquids the entire day - NO SOLID FOOD.   Diabetic medications adjustments for today: n/a  At 6:00pm: Complete steps 1 through 4 below, using ONE (1) 6-ounce bottle, before going to bed. Step 1:  Pour ONE (1) 6-ounce bottle of SUPREP liquid into the mixing container.  Step 2:  Add cool drinking water to the 16 ounce line on the container and mix.  Note: Dilute the solution concentrate as directed prior to use. Step 3:  DRINK ALL the liquid in the container. Step 4:  You MUST drink an additional two (2) or more 16 ounce containers of water over the next one (1) hour.   Continue clear liquids.  DAY OF PROCEDURE:   DATE: 05/25/2020   DAY: Monday If you take medications for your heart, blood pressure, or breathing, you may take these medications.  Diabetic medications adjustments for today: n/a  5 hours before your procedure at :  3:15 am Step 1:  Pour ONE (1) 6-ounce bottle of SUPREP liquid into the mixing container.  Step 2:  Add cool drinking water to the 16 ounce line on  the container and mix.  Note: Dilute the solution concentrate as directed prior to use. Step 3:  DRINK ALL the liquid in the container. Step 4:  You MUST drink an additional two (2) or more 16 ounce containers of water over the next one (1) hour. You MUST complete the final glass of water at least 3 hours before your colonoscopy. Nothing by mouth past 5:15 am.  You may take your morning medications with sip of water unless we have instructed otherwise.    Please see below for Dietary Information.  CLEAR LIQUIDS INCLUDE:  Water Jello (NOT red in color)   Ice Popsicles (NOT red in color)   Tea (sugar ok, no milk/cream) Powdered fruit flavored drinks  Coffee (sugar ok,  no milk/cream) Gatorade/ Lemonade/ Kool-Aid  (NOT red in color)   Juice: apple, white grape, white cranberry Soft drinks  Clear bullion, consomme, broth (fat free beef/chicken/vegetable)  Carbonated beverages (any kind)  Strained chicken noodle soup Hard Candy   Remember: Clear liquids are liquids that will allow you to see your fingers on the other side of a clear glass. Be sure liquids are NOT red in color, and not cloudy, but CLEAR.  DO NOT EAT OR DRINK ANY OF THE FOLLOWING:  Dairy products of any kind   Cranberry juice Tomato juice / V8 juice   Grapefruit juice Orange juice     Red grape juice  Do not eat any solid foods, including such foods as: cereal, oatmeal, yogurt, fruits, vegetables, creamed soups, eggs, bread, crackers, pureed foods in a blender, etc.   HELPFUL HINTS FOR DRINKING PREP SOLUTION:   Make sure prep is extremely cold. Mix and refrigerate the the morning of the prep. You may also put in the freezer.   You may try mixing some Crystal Light or Country Time Lemonade if you prefer. Mix in small amounts; add more if necessary.  Try drinking through a straw  Rinse mouth with water or a mouthwash between glasses, to remove after-taste.  Try sipping on a cold beverage /ice/ popsicles between glasses  of prep.  Place a piece of sugar-free hard candy in mouth between glasses.  If you become nauseated, try consuming smaller amounts, or stretch out the time between glasses. Stop for 30-60 minutes, then slowly start back drinking.     OTHER INSTRUCTIONS  You will need a responsible adult at least 64 years of age to accompany you and drive you home. This person must remain in the waiting room during your procedure. The hospital will cancel your procedure if you do not have a responsible adult with you.   1. Wear loose fitting clothing that is easily removed. 2. Leave jewelry and other valuables at home.  3. Remove all body piercing jewelry and leave at home. 4. Total time from sign-in until discharge is approximately 2-3 hours. 5. You should go home directly after your procedure and rest. You can resume normal activities the day after your procedure. 6. The day of your procedure you should not:  Drive  Make legal decisions  Operate machinery  Drink alcohol  Return to work   You may call the office (Dept: [phone]) before 5:00pm, or page the doctor on call [phone]) after 5:00pm, for further instructions, if necessary.   Insurance Information YOU WILL NEED TO CHECK WITH YOUR INSURANCE COMPANY FOR THE BENEFITS OF COVERAGE YOU HAVE FOR THIS PROCEDURE.  UNFORTUNATELY, NOT ALL INSURANCE COMPANIES HAVE BENEFITS TO COVER ALL OR PART OF THESE TYPES OF PROCEDURES.  IT IS YOUR RESPONSIBILITY TO CHECK YOUR BENEFITS, HOWEVER, WE WILL BE GLAD TO ASSIST YOU WITH ANY CODES YOUR INSURANCE COMPANY MAY NEED.    PLEASE NOTE THAT MOST INSURANCE COMPANIES WILL NOT COVER A SCREENING COLONOSCOPY FOR PEOPLE UNDER THE AGE OF 50  IF YOU HAVE BCBS INSURANCE, YOU MAY HAVE BENEFITS FOR A SCREENING COLONOSCOPY BUT IF POLYPS ARE FOUND THE DIAGNOSIS WILL CHANGE AND THEN YOU MAY HAVE A DEDUCTIBLE THAT WILL NEED TO BE MET. SO PLEASE MAKE SURE YOU CHECK YOUR BENEFITS FOR A SCREENING COLONOSCOPY AS WELL AS  A DIAGNOSTIC COLONOSCOPY.

## 2020-04-28 NOTE — Progress Notes (Addendum)
Gastroenterology Pre-Procedure Review  Request Date: 04/28/2020 Requesting Physician: Rowan Blase, PA @ [facility], Last TCS done 10/12/2015 by Dr. Oneida Alar, tubular adenoma  PATIENT REVIEW QUESTIONS: The patient responded to the following health history questions as indicated:    1. Diabetes Melitis: no 2. Joint replacements in the past 12 months: no 3. Major health problems in the past 3 months: no 4. Has an artificial valve or MVP: no 5. Has a defibrillator: no 6. Has been advised in past to take antibiotics in advance of a procedure like teeth cleaning: no 7. Family history of colon cancer: no  8. Alcohol Use: no 9. Illicit drug Use: no 10. History of sleep apnea: no  11. History of coronary artery or other vascular stents placed within the last 12 months: no 12. History of any prior anesthesia complications: no 13. Body mass index is 29.07 kg/m.    MEDICATIONS & ALLERGIES:    Patient reports the following regarding taking any blood thinners:   Plavix? no Aspirin? yes Coumadin? no Brilinta? no Xarelto? no Eliquis? no Pradaxa? no Savaysa? no Effient? no  Patient confirms/reports the following medications:  Current Outpatient Medications  Medication Sig Dispense Refill  . aspirin EC 81 MG tablet Take 81 mg by mouth daily. Swallow whole.    Marland Kitchen atorvastatin (LIPITOR) 20 MG tablet Take 20 mg by mouth daily in the afternoon.    Marland Kitchen Lifitegrast (XIIDRA) 5 % SOLN Place 1 drop into both eyes daily.    . Multiple Vitamins-Minerals (OCUVITE EXTRA) TABS Take 1 tablet by mouth daily in the afternoon.    Marland Kitchen olmesartan (BENICAR) 40 MG tablet Take 40 mg by mouth daily.    Vladimir Faster Glycol-Propyl Glycol (LUBRICATING EYE DROPS) 0.4-0.3 % SOLN Place 1 drop into both eyes 3 (three) times daily as needed (dry/irritated eyes.).     No current facility-administered medications for this visit.    Patient confirms/reports the following allergies:  No Known Allergies  No orders of the defined types  were placed in this encounter.   AUTHORIZATION INFORMATION Primary Insurance: [facility],  ID #:[id number] ,  Group #: [id number] Pre-Cert / Auth required: No, not required per fax Pre-Cert / Auth #: Ref#: [id number]  SCHEDULE INFORMATION: Procedure has been scheduled as follows:  Date: 05/25/2020, Time: 8:15 Location: [facility] with Dr. Abbey Chatters  This Gastroenterology Pre-Precedure Review Form is being routed to the following provider(s): Aliene Altes, PA

## 2020-04-30 NOTE — Progress Notes (Signed)
OK to schedule. ASA II 

## 2020-05-01 ENCOUNTER — Other Ambulatory Visit: Payer: Self-pay | Admitting: *Deleted

## 2020-05-04 ENCOUNTER — Other Ambulatory Visit: Payer: Self-pay

## 2020-05-04 ENCOUNTER — Encounter (INDEPENDENT_AMBULATORY_CARE_PROVIDER_SITE_OTHER): Payer: 59 | Admitting: Ophthalmology

## 2020-05-04 ENCOUNTER — Telehealth: Payer: Self-pay

## 2020-05-04 DIAGNOSIS — H43813 Vitreous degeneration, bilateral: Secondary | ICD-10-CM | POA: Diagnosis not present

## 2020-05-04 DIAGNOSIS — H35033 Hypertensive retinopathy, bilateral: Secondary | ICD-10-CM | POA: Diagnosis not present

## 2020-05-04 DIAGNOSIS — H3411 Central retinal artery occlusion, right eye: Secondary | ICD-10-CM

## 2020-05-04 DIAGNOSIS — I10 Essential (primary) hypertension: Secondary | ICD-10-CM

## 2020-05-04 NOTE — Telephone Encounter (Signed)
Pt called office and LMOVM asking if his covid test appt for 05/22/20 can be changed to [facility]. VM forwarded to Angie.

## 2020-05-04 NOTE — Telephone Encounter (Signed)
Pt called back and requested to have his Covid screening in [place].  He was made aware that the [facility] site was closed.  He was made aware that he could have it done at the site on [address].  Pt scheduled Covid screening there for 05/22/2020 at 10:25.

## 2020-05-04 NOTE — Telephone Encounter (Signed)
Tried to call pt back but had to [facility].

## 2020-05-22 ENCOUNTER — Other Ambulatory Visit (HOSPITAL_COMMUNITY): Payer: 59

## 2020-05-22 ENCOUNTER — Other Ambulatory Visit (HOSPITAL_COMMUNITY)
Admission: RE | Admit: 2020-05-22 | Discharge: 2020-05-22 | Disposition: A | Discharge status: Home / Self Care | Payer: 59 | Source: Ambulatory Visit | Attending: Internal Medicine | Admitting: Internal Medicine

## 2020-05-22 DIAGNOSIS — Z01812 Encounter for preprocedural laboratory examination: Secondary | ICD-10-CM | POA: Diagnosis present

## 2020-05-22 DIAGNOSIS — Z20822 Contact with and (suspected) exposure to covid-19: Secondary | ICD-10-CM | POA: Diagnosis not present

## 2020-05-22 LAB — SARS CORONAVIRUS 2 (TAT 6-24 HRS): SARS Coronavirus 2: NEGATIVE

## 2020-05-25 ENCOUNTER — Ambulatory Visit (HOSPITAL_COMMUNITY): Payer: 59 | Admitting: Anesthesiology

## 2020-05-25 ENCOUNTER — Ambulatory Visit (HOSPITAL_COMMUNITY)
Admission: RE | Admit: 2020-05-25 | Discharge: 2020-05-25 | Disposition: A | Discharge status: Home / Self Care | Payer: 59 | Attending: Internal Medicine | Admitting: Internal Medicine

## 2020-05-25 ENCOUNTER — Other Ambulatory Visit: Payer: Self-pay

## 2020-05-25 ENCOUNTER — Encounter (HOSPITAL_COMMUNITY)
Admission: RE | Disposition: A | Discharge status: Home / Self Care | Payer: Self-pay | Source: Home / Self Care | Attending: Internal Medicine

## 2020-05-25 ENCOUNTER — Encounter (HOSPITAL_COMMUNITY): Payer: Self-pay

## 2020-05-25 DIAGNOSIS — Z86010 Personal history of colonic polyps: Secondary | ICD-10-CM | POA: Insufficient documentation

## 2020-05-25 DIAGNOSIS — Z87891 Personal history of nicotine dependence: Secondary | ICD-10-CM | POA: Diagnosis not present

## 2020-05-25 DIAGNOSIS — Z1211 Encounter for screening for malignant neoplasm of colon: Secondary | ICD-10-CM | POA: Insufficient documentation

## 2020-05-25 DIAGNOSIS — K648 Other hemorrhoids: Secondary | ICD-10-CM | POA: Insufficient documentation

## 2020-05-25 DIAGNOSIS — Z79899 Other long term (current) drug therapy: Secondary | ICD-10-CM | POA: Insufficient documentation

## 2020-05-25 DIAGNOSIS — K5730 Diverticulosis of large intestine without perforation or abscess without bleeding: Secondary | ICD-10-CM | POA: Insufficient documentation

## 2020-05-25 DIAGNOSIS — Z7982 Long term (current) use of aspirin: Secondary | ICD-10-CM | POA: Insufficient documentation

## 2020-05-25 HISTORY — PX: COLONOSCOPY WITH PROPOFOL: SHX5780

## 2020-05-25 SURGERY — COLONOSCOPY WITH PROPOFOL
Anesthesia: General

## 2020-05-25 MED ORDER — PROPOFOL 500 MG/50ML IV EMUL
INTRAVENOUS | Status: DC | PRN
  Administered 2020-05-25 (×2): 100 ug/kg/min via INTRAVENOUS

## 2020-05-25 MED ORDER — PROPOFOL 10 MG/ML IV BOLUS
INTRAVENOUS | Status: DC | PRN
  Administered 2020-05-25: 125 mg via INTRAVENOUS
  Administered 2020-05-25: 25 mg via INTRAVENOUS

## 2020-05-25 MED ORDER — LACTATED RINGERS IV SOLN
INTRAVENOUS | Status: DC
  Administered 2020-05-25: 1000 mL via INTRAVENOUS

## 2020-05-25 MED ORDER — LIDOCAINE HCL (CARDIAC) PF 50 MG/5ML IV SOSY
PREFILLED_SYRINGE | INTRAVENOUS | Status: DC | PRN
  Administered 2020-05-25: 60 mg via INTRAVENOUS

## 2020-05-25 NOTE — Anesthesia Preprocedure Evaluation (Signed)
Anesthesia Evaluation  Patient identified by MRN, date of birth, ID band Patient awake    Reviewed: Allergy & Precautions, H&P , NPO status , Patient's Chart, lab work & pertinent test results, reviewed documented beta blocker date and time   Airway Mallampati: II  TM Distance: >3 FB Neck ROM: full    Dental no notable dental hx.    Pulmonary neg pulmonary ROS, former smoker,    Pulmonary exam normal breath sounds clear to auscultation       Cardiovascular Exercise Tolerance: Good hypertension, negative cardio ROS   Rhythm:regular Rate:Normal     Neuro/Psych negative neurological ROS  negative psych ROS   GI/Hepatic negative GI ROS, Neg liver ROS,   Endo/Other  negative endocrine ROS  Renal/GU negative Renal ROS  negative genitourinary   Musculoskeletal   Abdominal   Peds  Hematology negative hematology ROS (+)   Anesthesia Other Findings   Reproductive/Obstetrics negative OB ROS                             Anesthesia Physical Anesthesia Plan  ASA: II  Anesthesia Plan: General   Post-op Pain Management:    Induction:   PONV Risk Score and Plan: Propofol infusion  Airway Management Planned:   Additional Equipment:   Intra-op Plan:   Post-operative Plan:   Informed Consent: I have reviewed the patients History and Physical, chart, labs and discussed the procedure including the risks, benefits and alternatives for the proposed anesthesia with the patient or authorized representative who has indicated his/her understanding and acceptance.     Dental Advisory Given  Plan Discussed with: CRNA  Anesthesia Plan Comments:         Anesthesia Quick Evaluation  

## 2020-05-25 NOTE — H&P (Signed)
Primary Care Physician:  Sharilyn Sites, MD Primary Gastroenterologist:  Dr. Abbey Chatters  Pre-Procedure History & Physical: HPI:  Fred Carroll is a 64 y.o. male is herefor a colonoscopy to be performed for surveillance due to personal history of adenomatous colon polyps. .  Patient denies any family history of colorectal cancer.  No melena or hematochezia.  No abdominal pain or unintentional weight loss.  No change in bowel habits.  Overall feels well from a GI standpoint.  Past Medical History:  Diagnosis Date  . Arthritis   . Cancer ([place])    melanoma foot removed  . Dry eye   . History of kidney stones   . Hypertension   . Lazy eye    left    Past Surgical History:  Procedure Laterality Date  . COLONOSCOPY N/A 10/12/2015   Procedure: COLONOSCOPY;  Surgeon: Danie Binder, MD;  Location: [facility];  Service: Endoscopy;  Laterality: N/A;  2:30 PM  . 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 Authorizing Provider  aspirin EC 81 MG tablet Take 81 mg by mouth daily. Swallow whole.   Yes [provider]  atorvastatin (LIPITOR) 40 MG tablet Take 40 mg by mouth daily in the afternoon.    Yes [provider]  Lifitegrast Shirley Friar) 5 % SOLN Place 1 drop into both eyes daily.   Yes [provider]  Multiple Vitamins-Minerals (OCUVITE EXTRA) TABS Take 1 tablet by mouth daily in the afternoon.   Yes [provider]  olmesartan (BENICAR) 40 MG tablet Take 40 mg by mouth daily.   Yes [provider]  Polyethyl Glycol-Propyl Glycol (LUBRICATING EYE DROPS) 0.4-0.3 % SOLN Place 1 drop into both eyes 3 (three) times daily as needed (dry/irritated eyes.).   Yes [provider]  Polyethyl Glycol-Propyl Glycol (SYSTANE) 0.4-0.3 % GEL ophthalmic gel Place 1 application into both eyes daily as needed (Dry eye).   Yes [provider]    Allergies as of  04/30/2020  . (No Known Allergies)    Family History  Problem Relation Age of Onset  . Colon cancer Neg Hx     Social History   Socioeconomic History  . Marital status: Divorced    Spouse name: Not on file  . Number of children: Not on file  . Years of education: Not on file  . Highest education level: Not on file  Occupational History  . Not on file  Tobacco Use  . Smoking status: Former Smoker    Types: Cigarettes  . Smokeless tobacco: Never Used  . Tobacco comment: off and on one cigarette with a beer   Vaping Use  . Vaping Use: Never used  Substance and Sexual Activity  . Alcohol use: Not Currently  . Drug use: Never  . Sexual activity: Not Currently  Other Topics Concern  . Not on file  Social History Narrative  . Not on file   Social Determinants of Health   Financial Resource Strain: Not on file  Food Insecurity: Not on file  Transportation Needs: Not on file  Physical Activity: Not on file  Stress: Not on file  Social Connections: Not on file  Intimate Partner Violence: Not on file    Review of Systems: See HPI, otherwise negative ROS  Impression/Plan: Fred Carroll is here for a colonoscopy to be performed for surveillance due to personal history of  adenomatous colon polyps.   The risks of the procedure including infection, bleed, or perforation as well as benefits, limitations, alternatives and imponderables have been reviewed with the patient. Questions have been answered. All parties agreeable.

## 2020-05-25 NOTE — Anesthesia Procedure Notes (Signed)
Date/Time: 05/25/2020 8:41 AM Performed by: Vista Deck, CRNA Pre-anesthesia Checklist: Patient identified, Emergency Drugs available, Suction available, Timeout performed and Patient being monitored Patient Re-evaluated:Patient Re-evaluated prior to induction Oxygen Delivery Method: Nasal Cannula

## 2020-05-25 NOTE — Discharge Instructions (Addendum)
Colonoscopy Discharge Instructions  Read the instructions outlined below and refer to this sheet in the next few weeks. These discharge instructions provide you with general information on caring for yourself after you leave the hospital. Your doctor may also give you specific instructions. While your treatment has been planned according to the most current medical practices available, unavoidable complications occasionally occur.   ACTIVITY  You may resume your regular activity, but move at a slower pace for the next 24 hours.   Take frequent rest periods for the next 24 hours.   Walking will help get rid of the air and reduce the bloated feeling in your belly (abdomen).   No driving for 24 hours (because of the medicine (anesthesia) used during the test).    Do not sign any important legal documents or operate any machinery for 24 hours (because of the anesthesia used during the test).  NUTRITION  Drink plenty of fluids.   You may resume your normal diet as instructed by your doctor.   Begin with a light meal and progress to your normal diet. Heavy or fried foods are harder to digest and may make you feel sick to your stomach (nauseated).   Avoid alcoholic beverages for 24 hours or as instructed.  MEDICATIONS  You may resume your normal medications unless your doctor tells you otherwise.  WHAT YOU CAN EXPECT TODAY  Some feelings of bloating in the abdomen.   Passage of more gas than usual.   Spotting of blood in your stool or on the toilet paper.  IF YOU HAD POLYPS REMOVED DURING THE COLONOSCOPY:  No aspirin products for 7 days or as instructed.   No alcohol for 7 days or as instructed.   Eat a soft diet for the next 24 hours.  FINDING OUT THE RESULTS OF YOUR TEST Not all test results are available during your visit. If your test results are not back during the visit, make an appointment with your caregiver to find out the results. Do not assume everything is normal if  you have not heard from your caregiver or the medical facility. It is important for you to follow up on all of your test results.  SEEK IMMEDIATE MEDICAL ATTENTION IF:  You have more than a spotting of blood in your stool.   Your belly is swollen (abdominal distention).   You are nauseated or vomiting.   You have a temperature over 101.   You have abdominal pain or discomfort that is severe or gets worse throughout the day.   Your colonoscopy was relatively unremarkable. I did not find any polyps or evidence of colon cancer. Repeat in 10 years.  You do have diverticulosis and internal hemorrhoids. I would recommend increasing fiber in your diet or adding OTC Benefiber/Metamucil. Be sure to drink at least 4 to 6 glasses of water daily. Follow-up with GI as needed.   I hope you have a great rest of your week!  Elon Alas. Abbey Chatters, D.O. Gastroenterology and Hepatology [facility]   Diverticulosis  Diverticulosis is a condition that develops when small pouches (diverticula) form in the wall of the large intestine (colon). The colon is where water is absorbed and stool (feces) is formed. The pouches form when the inside layer of the colon pushes through weak spots in the outer layers of the colon. You may have a few pouches or many of them. The pouches usually do not cause problems unless they become inflamed or infected. When this happens,  the condition is called diverticulitis. What are the causes? The cause of this condition is not known. What increases the risk? The following factors may make you more likely to develop this condition:  Being older than age 53. Your risk for this condition increases with age. Diverticulosis is rare among people younger than age 78. By age 54, many people have it.  Eating a low-fiber diet.  Having frequent constipation.  Being overweight.  Not getting enough exercise.  Smoking.  Taking over-the-counter pain  medicines, like aspirin and ibuprofen.  Having a family history of diverticulosis. What are the signs or symptoms? In most people, there are no symptoms of this condition. If you do have symptoms, they may include:  Bloating.  Cramps in the abdomen.  Constipation or diarrhea.  Pain in the lower left side of the abdomen. How is this diagnosed? Because diverticulosis usually has no symptoms, it is most often diagnosed during an exam for other colon problems. The condition may be diagnosed by:  Using a flexible scope to examine the colon (colonoscopy).  Taking an X-ray of the colon after dye has been put into the colon (barium enema).  Having a CT scan. How is this treated? You may not need treatment for this condition. Your health care provider may recommend treatment to prevent problems. You may need treatment if you have symptoms or if you previously had diverticulitis. Treatment may include:  Eating a high-fiber diet.  Taking a fiber supplement.  Taking a live bacteria supplement (probiotic).  Taking medicine to relax your colon. Follow these instructions at home: Medicines  Take over-the-counter and prescription medicines only as told by your health care provider.  If told by your health care provider, take a fiber supplement or probiotic. Constipation prevention Your condition may cause constipation. To prevent or treat constipation, you may need to:  Drink enough fluid to keep your urine pale yellow.  Take over-the-counter or prescription medicines.  Eat foods that are high in fiber, such as beans, whole grains, and fresh fruits and vegetables.  Limit foods that are high in fat and processed sugars, such as fried or sweet foods.  General instructions  Try not to strain when you have a bowel movement.  Keep all follow-up visits as told by your health care provider. This is important. Contact a health care provider if you:  Have pain in your abdomen.  Have  bloating.  Have cramps.  Have not had a bowel movement in 3 days. Get help right away if:  Your pain gets worse.  Your bloating becomes very bad.  You have a fever or chills, and your symptoms suddenly get worse.  You vomit.  You have bowel movements that are bloody or black.  You have bleeding from your rectum. Summary  Diverticulosis is a condition that develops when small pouches (diverticula) form in the wall of the large intestine (colon).  You may have a few pouches or many of them.  This condition is most often diagnosed during an exam for other colon problems.  Treatment may include increasing the fiber in your diet, taking supplements, or taking medicines. This information is not intended to replace advice given to you by your health care provider. Make sure you discuss any questions you have with your health care provider. Document Revised: 12/27/2018 Document Reviewed: 12/27/2018 Elsevier Patient Education  [place].

## 2020-05-25 NOTE — Anesthesia Postprocedure Evaluation (Signed)
Anesthesia Post Note  Patient: Fred Carroll  Procedure(s) Performed: COLONOSCOPY WITH PROPOFOL (N/A )  Patient location during evaluation: Endoscopy Anesthesia Type: General Level of consciousness: awake and alert and patient cooperative Pain management: satisfactory to patient Vital Signs Assessment: post-procedure vital signs reviewed and stable Respiratory status: spontaneous breathing Cardiovascular status: stable Postop Assessment: no apparent nausea or vomiting Anesthetic complications: no   No complications documented.   Last Vitals:  Vitals:   05/25/20 0652 05/25/20 0905  BP: (!) 172/95 103/67  Pulse: 73 (!) 52  Resp: 19 16  Temp: 37.1 C 36.5 C  SpO2: 98% 100%    Last Pain:  Vitals:   05/25/20 0905  TempSrc: Oral  PainSc: 0-No pain                 Tyreesha Maharaj

## 2020-05-25 NOTE — Transfer of Care (Signed)
Immediate Anesthesia Transfer of Care Note  Patient: Fred Carroll  Procedure(s) Performed: COLONOSCOPY WITH PROPOFOL (N/A )  Patient Location: Endoscopy Unit  Anesthesia Type:General  Level of Consciousness: awake and patient cooperative  Airway & Oxygen Therapy: Patient Spontanous Breathing  Post-op Assessment: Report given to RN and Post -op Vital signs reviewed and stable  Post vital signs: Reviewed and stable  Last Vitals:  Vitals Value Taken Time  BP    Temp    Pulse    Resp    SpO2      Last Pain:  Vitals:   05/25/20 0843  TempSrc:   PainSc: 0-No pain    SEE VITAL SIGN FLOW SHEET  Patients Stated Pain Goal: 9 (28/78/67 6720)  Complications: No complications documented.

## 2020-05-25 NOTE — Op Note (Signed)
[facility] Patient Name: Fred Carroll Procedure Date: 05/25/2020 8:36 AM MRN: [id number] Date of Birth: Nov 26, 1955 Attending MD: Elon Alas. Abbey Chatters DO CSN: [id number] Age: 64 Admit Type: Outpatient Procedure:                Colonoscopy Indications:              High risk colon cancer surveillance: Personal                            history of colonic polyps Providers:                Elon Alas. Jazel Nimmons, DO, Otis Peak B. Sharon Seller, RN,                            Caprice Kluver, Nelma Rothman, Technician Referring MD:              Medicines:                See the Anesthesia note for documentation of the                            administered medications Complications:            No immediate complications. Estimated Blood Loss:     Estimated blood loss: none. Procedure:                Pre-Anesthesia Assessment:                           - The anesthesia plan was to use monitored                            anesthesia care (MAC).                           After obtaining informed consent, the colonoscope                            was passed under direct vision. Throughout the                            procedure, the patient's blood pressure, pulse, and                            oxygen saturations were monitored continuously. The                            [id number]-[id number]) was introduced through the anus                            and advanced to the the cecum, identified by                            appendiceal orifice and ileocecal valve. The                            colonoscopy was performed without difficulty. The  patient tolerated the procedure well. The quality                            of the bowel preparation was evaluated using the                            BBPS [facility] Bowel Preparation Scale) with scores                            of: Right Colon = 2 (minor amount of residual                            staining, small fragments of stool  and/or opaque                            liquid, but mucosa seen well), Transverse Colon = 2                            (minor amount of residual staining, small fragments                            of stool and/or opaque liquid, but mucosa seen                            well) and Left Colon = 3 (entire mucosa seen well                            with no residual staining, small fragments of stool                            or opaque liquid). The total BBPS score equals 7.                            The quality of the bowel preparation was good. Scope In: 8:48:07 AM Scope Out: 8:59:27 AM Scope Withdrawal Time: 0 hours 7 minutes 56 seconds  Total Procedure Duration: 0 hours 11 minutes 20 seconds  Findings:      The perianal and digital rectal examinations were normal.      Non-bleeding internal hemorrhoids were found during endoscopy.      Multiple small-mouthed diverticula were found in the sigmoid colon and       descending colon.      The exam was otherwise without abnormality. Impression:               - Non-bleeding internal hemorrhoids.                           - Diverticulosis in the sigmoid colon and in the                            descending colon.                           - The examination was otherwise normal.                           -  No specimens collected. Moderate Sedation:      Per Anesthesia Care Recommendation:           - Patient has a contact number available for                            emergencies. The signs and symptoms of potential                            delayed complications were discussed with the                            patient. Return to normal activities tomorrow.                            Written discharge instructions were provided to the                            patient.                           - Resume previous diet.                           - Continue present medications.                           - Repeat colonoscopy in 10 years  for screening                            purposes.                           - Return to GI clinic PRN. Procedure Code(s):        --- Professional ---                           [phone], Colonoscopy, flexible; diagnostic, including                            collection of specimen(s) by brushing or washing,                            when performed (separate procedure) Diagnosis Code(s):        --- Professional ---                           Z86.010, Personal history of colonic polyps                           K64.8, Other hemorrhoids                           K57.30, Diverticulosis of large intestine without                            perforation or abscess without bleeding CPT copyright 2019 [employer].  All rights reserved. The codes documented in this report are preliminary and upon coder review may  be revised to meet current compliance requirements. Elon Alas. Abbey Chatters, DO [place] Trevyn Lumpkin, DO 05/25/2020 9:01:56 AM This report has been signed electronically. Number of Addenda: 0

## 2020-06-01 ENCOUNTER — Encounter (HOSPITAL_COMMUNITY): Payer: Self-pay | Admitting: Internal Medicine

## 2020-08-04 ENCOUNTER — Other Ambulatory Visit: Payer: Self-pay

## 2020-08-04 ENCOUNTER — Encounter (INDEPENDENT_AMBULATORY_CARE_PROVIDER_SITE_OTHER): Payer: 59 | Admitting: Ophthalmology

## 2020-08-04 DIAGNOSIS — H35033 Hypertensive retinopathy, bilateral: Secondary | ICD-10-CM

## 2020-08-04 DIAGNOSIS — H43813 Vitreous degeneration, bilateral: Secondary | ICD-10-CM

## 2020-08-04 DIAGNOSIS — H3411 Central retinal artery occlusion, right eye: Secondary | ICD-10-CM

## 2020-08-04 DIAGNOSIS — I10 Essential (primary) hypertension: Secondary | ICD-10-CM | POA: Diagnosis not present

## 2020-08-04 DIAGNOSIS — H2513 Age-related nuclear cataract, bilateral: Secondary | ICD-10-CM

## 2020-12-09 ENCOUNTER — Telehealth: Payer: Self-pay | Admitting: *Deleted

## 2020-12-09 ENCOUNTER — Other Ambulatory Visit: Payer: Self-pay | Admitting: *Deleted

## 2020-12-09 ENCOUNTER — Ambulatory Visit: Payer: 59

## 2020-12-09 DIAGNOSIS — R55 Syncope and collapse: Secondary | ICD-10-CM

## 2020-12-09 NOTE — Progress Notes (Unsigned)
Patient enrolled for [employer] to ship 3 day long term holter monitor to his home.  Patient called with instructions.

## 2020-12-09 NOTE — Telephone Encounter (Signed)
Patient enrolled for [employer] to ship a 3 day Long term holter monitor to his home.  Instructions reviewed briefly as they will be included in his monitor kit.  Patient aware he can contact [employer] once he receives his kit to get out of pocket cost quote.  If he is not agreeable to cost of monitor, he can ship it back to [employer] unused in box with prepaid [employer] shipping label.  There will be no charge for any monitor returned unused.

## 2021-02-01 ENCOUNTER — Other Ambulatory Visit: Payer: Self-pay

## 2021-02-01 ENCOUNTER — Encounter (INDEPENDENT_AMBULATORY_CARE_PROVIDER_SITE_OTHER): Payer: Medicare Other | Admitting: Ophthalmology

## 2021-02-01 DIAGNOSIS — H3411 Central retinal artery occlusion, right eye: Secondary | ICD-10-CM | POA: Diagnosis not present

## 2021-02-01 DIAGNOSIS — H2513 Age-related nuclear cataract, bilateral: Secondary | ICD-10-CM

## 2021-02-01 DIAGNOSIS — I10 Essential (primary) hypertension: Secondary | ICD-10-CM

## 2021-02-01 DIAGNOSIS — H43813 Vitreous degeneration, bilateral: Secondary | ICD-10-CM | POA: Diagnosis not present

## 2021-02-01 DIAGNOSIS — H35033 Hypertensive retinopathy, bilateral: Secondary | ICD-10-CM | POA: Diagnosis not present

## 2021-05-10 ENCOUNTER — Other Ambulatory Visit (HOSPITAL_COMMUNITY): Payer: Self-pay | Admitting: Family Medicine

## 2021-05-10 DIAGNOSIS — R0989 Other specified symptoms and signs involving the circulatory and respiratory systems: Secondary | ICD-10-CM

## 2021-05-12 ENCOUNTER — Ambulatory Visit (HOSPITAL_COMMUNITY)
Admission: RE | Admit: 2021-05-12 | Discharge: 2021-05-12 | Disposition: A | Discharge status: Home / Self Care | Payer: Medicare Other | Source: Ambulatory Visit | Attending: Family Medicine | Admitting: Family Medicine

## 2021-05-12 ENCOUNTER — Other Ambulatory Visit: Payer: Self-pay

## 2021-05-12 DIAGNOSIS — R0989 Other specified symptoms and signs involving the circulatory and respiratory systems: Secondary | ICD-10-CM | POA: Insufficient documentation

## 2021-05-12 NOTE — Progress Notes (Signed)
Carotid duplex has been completed.   Preliminary results in CV Proc.   Fred Carroll 05/12/2021 10:31 AM

## 2021-08-04 ENCOUNTER — Encounter (INDEPENDENT_AMBULATORY_CARE_PROVIDER_SITE_OTHER): Payer: Medicare Other | Admitting: Ophthalmology

## 2021-08-04 ENCOUNTER — Other Ambulatory Visit: Payer: Self-pay

## 2021-08-04 DIAGNOSIS — H3411 Central retinal artery occlusion, right eye: Secondary | ICD-10-CM

## 2021-08-04 DIAGNOSIS — I10 Essential (primary) hypertension: Secondary | ICD-10-CM | POA: Diagnosis not present

## 2021-08-04 DIAGNOSIS — H35033 Hypertensive retinopathy, bilateral: Secondary | ICD-10-CM

## 2021-08-04 DIAGNOSIS — H43813 Vitreous degeneration, bilateral: Secondary | ICD-10-CM

## 2021-08-16 DIAGNOSIS — M1612 Unilateral primary osteoarthritis, left hip: Secondary | ICD-10-CM | POA: Diagnosis not present

## 2021-08-16 DIAGNOSIS — I10 Essential (primary) hypertension: Secondary | ICD-10-CM | POA: Diagnosis not present

## 2021-10-11 DIAGNOSIS — M1612 Unilateral primary osteoarthritis, left hip: Secondary | ICD-10-CM | POA: Diagnosis not present

## 2021-11-09 DIAGNOSIS — I10 Essential (primary) hypertension: Secondary | ICD-10-CM | POA: Diagnosis not present

## 2021-11-09 DIAGNOSIS — M1612 Unilateral primary osteoarthritis, left hip: Secondary | ICD-10-CM | POA: Diagnosis not present

## 2021-11-09 DIAGNOSIS — Z0000 Encounter for general adult medical examination without abnormal findings: Secondary | ICD-10-CM | POA: Diagnosis not present

## 2022-02-01 ENCOUNTER — Encounter (INDEPENDENT_AMBULATORY_CARE_PROVIDER_SITE_OTHER): Payer: Medicare Other | Admitting: Ophthalmology

## 2022-02-02 ENCOUNTER — Encounter (INDEPENDENT_AMBULATORY_CARE_PROVIDER_SITE_OTHER): Payer: Medicare Other | Admitting: Ophthalmology

## 2022-02-02 DIAGNOSIS — H43813 Vitreous degeneration, bilateral: Secondary | ICD-10-CM | POA: Diagnosis not present

## 2022-02-02 DIAGNOSIS — H35033 Hypertensive retinopathy, bilateral: Secondary | ICD-10-CM | POA: Diagnosis not present

## 2022-02-02 DIAGNOSIS — H3411 Central retinal artery occlusion, right eye: Secondary | ICD-10-CM | POA: Diagnosis not present

## 2022-02-02 DIAGNOSIS — I10 Essential (primary) hypertension: Secondary | ICD-10-CM

## 2022-03-14 DIAGNOSIS — I10 Essential (primary) hypertension: Secondary | ICD-10-CM | POA: Diagnosis not present

## 2022-03-14 DIAGNOSIS — M162 Bilateral osteoarthritis resulting from hip dysplasia: Secondary | ICD-10-CM | POA: Diagnosis not present

## 2022-03-14 DIAGNOSIS — M1612 Unilateral primary osteoarthritis, left hip: Secondary | ICD-10-CM | POA: Diagnosis not present

## 2022-03-14 DIAGNOSIS — E785 Hyperlipidemia, unspecified: Secondary | ICD-10-CM | POA: Diagnosis not present

## 2022-04-15 DIAGNOSIS — M1612 Unilateral primary osteoarthritis, left hip: Secondary | ICD-10-CM | POA: Diagnosis not present

## 2022-07-18 DIAGNOSIS — E785 Hyperlipidemia, unspecified: Secondary | ICD-10-CM | POA: Diagnosis not present

## 2022-07-18 DIAGNOSIS — I10 Essential (primary) hypertension: Secondary | ICD-10-CM | POA: Diagnosis not present

## 2022-08-05 ENCOUNTER — Encounter (INDEPENDENT_AMBULATORY_CARE_PROVIDER_SITE_OTHER): Payer: Medicare Other | Admitting: Ophthalmology

## 2022-08-05 DIAGNOSIS — I10 Essential (primary) hypertension: Secondary | ICD-10-CM | POA: Diagnosis not present

## 2022-08-05 DIAGNOSIS — H43813 Vitreous degeneration, bilateral: Secondary | ICD-10-CM

## 2022-08-05 DIAGNOSIS — H35033 Hypertensive retinopathy, bilateral: Secondary | ICD-10-CM | POA: Diagnosis not present

## 2022-08-05 DIAGNOSIS — H3411 Central retinal artery occlusion, right eye: Secondary | ICD-10-CM | POA: Diagnosis not present

## 2022-10-04 DIAGNOSIS — H2513 Age-related nuclear cataract, bilateral: Secondary | ICD-10-CM | POA: Diagnosis not present

## 2022-10-04 DIAGNOSIS — H0288B Meibomian gland dysfunction left eye, upper and lower eyelids: Secondary | ICD-10-CM | POA: Diagnosis not present

## 2022-10-04 DIAGNOSIS — H3411 Central retinal artery occlusion, right eye: Secondary | ICD-10-CM | POA: Diagnosis not present

## 2022-10-04 DIAGNOSIS — H40033 Anatomical narrow angle, bilateral: Secondary | ICD-10-CM | POA: Diagnosis not present

## 2022-10-04 DIAGNOSIS — H40023 Open angle with borderline findings, high risk, bilateral: Secondary | ICD-10-CM | POA: Diagnosis not present

## 2022-10-04 DIAGNOSIS — H34212 Partial retinal artery occlusion, left eye: Secondary | ICD-10-CM | POA: Diagnosis not present

## 2022-10-04 DIAGNOSIS — H0288A Meibomian gland dysfunction right eye, upper and lower eyelids: Secondary | ICD-10-CM | POA: Diagnosis not present

## 2022-10-28 DIAGNOSIS — M1612 Unilateral primary osteoarthritis, left hip: Secondary | ICD-10-CM | POA: Diagnosis not present

## 2022-11-11 DIAGNOSIS — I10 Essential (primary) hypertension: Secondary | ICD-10-CM | POA: Diagnosis not present

## 2022-11-11 DIAGNOSIS — E782 Mixed hyperlipidemia: Secondary | ICD-10-CM | POA: Diagnosis not present

## 2022-11-15 DIAGNOSIS — I10 Essential (primary) hypertension: Secondary | ICD-10-CM | POA: Diagnosis not present

## 2022-11-15 DIAGNOSIS — E7800 Pure hypercholesterolemia, unspecified: Secondary | ICD-10-CM | POA: Diagnosis not present

## 2022-11-15 DIAGNOSIS — M1612 Unilateral primary osteoarthritis, left hip: Secondary | ICD-10-CM | POA: Diagnosis not present

## 2022-11-15 DIAGNOSIS — Z0001 Encounter for general adult medical examination with abnormal findings: Secondary | ICD-10-CM | POA: Diagnosis not present

## 2022-11-15 DIAGNOSIS — M162 Bilateral osteoarthritis resulting from hip dysplasia: Secondary | ICD-10-CM | POA: Diagnosis not present

## 2022-11-15 DIAGNOSIS — E1169 Type 2 diabetes mellitus with other specified complication: Secondary | ICD-10-CM | POA: Diagnosis not present

## 2023-03-21 DIAGNOSIS — I10 Essential (primary) hypertension: Secondary | ICD-10-CM | POA: Diagnosis not present

## 2023-03-21 DIAGNOSIS — E7800 Pure hypercholesterolemia, unspecified: Secondary | ICD-10-CM | POA: Diagnosis not present

## 2023-04-18 DIAGNOSIS — M1612 Unilateral primary osteoarthritis, left hip: Secondary | ICD-10-CM | POA: Diagnosis not present

## 2023-06-28 DIAGNOSIS — E782 Mixed hyperlipidemia: Secondary | ICD-10-CM | POA: Diagnosis not present

## 2023-06-28 DIAGNOSIS — I10 Essential (primary) hypertension: Secondary | ICD-10-CM | POA: Diagnosis not present

## 2023-06-28 DIAGNOSIS — M162 Bilateral osteoarthritis resulting from hip dysplasia: Secondary | ICD-10-CM | POA: Diagnosis not present

## 2023-06-28 DIAGNOSIS — J019 Acute sinusitis, unspecified: Secondary | ICD-10-CM | POA: Diagnosis not present

## 2023-07-31 DIAGNOSIS — E782 Mixed hyperlipidemia: Secondary | ICD-10-CM | POA: Diagnosis not present

## 2023-07-31 DIAGNOSIS — Z131 Encounter for screening for diabetes mellitus: Secondary | ICD-10-CM | POA: Diagnosis not present

## 2023-08-11 ENCOUNTER — Encounter (INDEPENDENT_AMBULATORY_CARE_PROVIDER_SITE_OTHER): Payer: Medicare Other | Admitting: Ophthalmology

## 2023-08-11 DIAGNOSIS — H2513 Age-related nuclear cataract, bilateral: Secondary | ICD-10-CM | POA: Diagnosis not present

## 2023-08-11 DIAGNOSIS — I10 Essential (primary) hypertension: Secondary | ICD-10-CM

## 2023-08-11 DIAGNOSIS — H43813 Vitreous degeneration, bilateral: Secondary | ICD-10-CM | POA: Diagnosis not present

## 2023-08-11 DIAGNOSIS — H3411 Central retinal artery occlusion, right eye: Secondary | ICD-10-CM | POA: Diagnosis not present

## 2023-08-11 DIAGNOSIS — H35033 Hypertensive retinopathy, bilateral: Secondary | ICD-10-CM

## 2023-09-26 DIAGNOSIS — M1612 Unilateral primary osteoarthritis, left hip: Secondary | ICD-10-CM | POA: Diagnosis not present

## 2023-09-26 DIAGNOSIS — N1832 Chronic kidney disease, stage 3b: Secondary | ICD-10-CM | POA: Diagnosis not present

## 2023-09-26 DIAGNOSIS — Z0000 Encounter for general adult medical examination without abnormal findings: Secondary | ICD-10-CM | POA: Diagnosis not present

## 2023-09-26 DIAGNOSIS — I129 Hypertensive chronic kidney disease with stage 1 through stage 4 chronic kidney disease, or unspecified chronic kidney disease: Secondary | ICD-10-CM | POA: Diagnosis not present

## 2023-09-26 DIAGNOSIS — E7800 Pure hypercholesterolemia, unspecified: Secondary | ICD-10-CM | POA: Diagnosis not present

## 2023-10-11 DIAGNOSIS — H40033 Anatomical narrow angle, bilateral: Secondary | ICD-10-CM | POA: Diagnosis not present

## 2023-10-11 DIAGNOSIS — H2513 Age-related nuclear cataract, bilateral: Secondary | ICD-10-CM | POA: Diagnosis not present

## 2023-10-11 DIAGNOSIS — H34212 Partial retinal artery occlusion, left eye: Secondary | ICD-10-CM | POA: Diagnosis not present

## 2023-10-11 DIAGNOSIS — H3411 Central retinal artery occlusion, right eye: Secondary | ICD-10-CM | POA: Diagnosis not present

## 2023-10-11 DIAGNOSIS — H40023 Open angle with borderline findings, high risk, bilateral: Secondary | ICD-10-CM | POA: Diagnosis not present

## 2023-10-19 DIAGNOSIS — H2511 Age-related nuclear cataract, right eye: Secondary | ICD-10-CM | POA: Diagnosis not present

## 2023-10-30 DIAGNOSIS — H2512 Age-related nuclear cataract, left eye: Secondary | ICD-10-CM | POA: Diagnosis not present

## 2023-11-02 DIAGNOSIS — H2512 Age-related nuclear cataract, left eye: Secondary | ICD-10-CM | POA: Diagnosis not present

## 2023-11-15 DIAGNOSIS — J069 Acute upper respiratory infection, unspecified: Secondary | ICD-10-CM | POA: Diagnosis not present

## 2023-12-19 DIAGNOSIS — I779 Disorder of arteries and arterioles, unspecified: Secondary | ICD-10-CM | POA: Diagnosis not present

## 2023-12-19 DIAGNOSIS — E785 Hyperlipidemia, unspecified: Secondary | ICD-10-CM | POA: Diagnosis not present

## 2023-12-19 DIAGNOSIS — I129 Hypertensive chronic kidney disease with stage 1 through stage 4 chronic kidney disease, or unspecified chronic kidney disease: Secondary | ICD-10-CM | POA: Diagnosis not present

## 2023-12-19 DIAGNOSIS — R809 Proteinuria, unspecified: Secondary | ICD-10-CM | POA: Diagnosis not present

## 2023-12-19 DIAGNOSIS — N1832 Chronic kidney disease, stage 3b: Secondary | ICD-10-CM | POA: Diagnosis not present

## 2023-12-26 DIAGNOSIS — I129 Hypertensive chronic kidney disease with stage 1 through stage 4 chronic kidney disease, or unspecified chronic kidney disease: Secondary | ICD-10-CM | POA: Diagnosis not present

## 2023-12-26 DIAGNOSIS — M1612 Unilateral primary osteoarthritis, left hip: Secondary | ICD-10-CM | POA: Diagnosis not present

## 2023-12-26 DIAGNOSIS — E7800 Pure hypercholesterolemia, unspecified: Secondary | ICD-10-CM | POA: Diagnosis not present

## 2023-12-26 DIAGNOSIS — N1832 Chronic kidney disease, stage 3b: Secondary | ICD-10-CM | POA: Diagnosis not present

## 2023-12-28 ENCOUNTER — Telehealth: Payer: Self-pay

## 2023-12-28 NOTE — Progress Notes (Signed)
   12/28/2023  Patient ID: Fred Carroll, male   DOB: 18-Feb-1956, 68 y.o.   MRN: [id number]  Contacted patient regarding referral for medication access from Leigh Lung, MD.  Left patient a voicemail to return my call at their convenience.  Heather Factor, PharmD Clinical Pharmacist  [phone]

## 2024-01-01 NOTE — Progress Notes (Signed)
   01/01/2024  Patient ID: Fred Carroll, male   DOB: [birth year], 68 y.o.   MRN: [id number]  Contacted patient regarding referral for medication access from Leigh Lung, MD.  Left patient a voicemail to return my call at their convenience.  Heather Factor, PharmD Clinical Pharmacist  [phone]

## 2024-01-11 NOTE — Progress Notes (Signed)
   01/11/2024  Patient ID: Fred Carroll, male   DOB: December 21, 1955, 68 y.o.   MRN: [id number]  Contacted patient regarding referral for medication access from Leigh Lung, MD.  Unable to leave a voicemail as the patient's mailbox is full. I will make another attempt next week.  Heather Factor, PharmD Clinical Pharmacist  [phone]

## 2024-01-18 NOTE — Progress Notes (Signed)
   01/18/2024  Patient ID: Fred Carroll, male   DOB: 1955/06/20, 68 y.o.   MRN: [id number]  Contacted patient regarding referral for medication access from Leigh Lung, MD.  Left patient a voicemail to return my call at their convenience.  Heather Factor, PharmD Clinical Pharmacist  [phone]

## 2024-01-25 NOTE — Progress Notes (Signed)
   01/25/2024  Patient ID: Fred Carroll, male   DOB: Dec 22, 1955, 68 y.o.   MRN: [id number]  Contacted patient regarding referral for medication access from Leigh Lung, MD.  Left patient a voicemail to return my call at their convenience./  The patient has an upcoming PCP on 03/26/24, I will follow-up again prior to appointment.  Heather Factor, PharmD Clinical Pharmacist  [phone]

## 2024-02-06 DIAGNOSIS — M1612 Unilateral primary osteoarthritis, left hip: Secondary | ICD-10-CM | POA: Diagnosis not present

## 2024-02-26 DIAGNOSIS — I10 Essential (primary) hypertension: Secondary | ICD-10-CM | POA: Diagnosis not present

## 2024-02-26 DIAGNOSIS — Z7901 Long term (current) use of anticoagulants: Secondary | ICD-10-CM | POA: Diagnosis not present

## 2024-02-26 DIAGNOSIS — Z0000 Encounter for general adult medical examination without abnormal findings: Secondary | ICD-10-CM | POA: Diagnosis not present

## 2024-02-26 DIAGNOSIS — N1832 Chronic kidney disease, stage 3b: Secondary | ICD-10-CM | POA: Diagnosis not present

## 2024-02-26 DIAGNOSIS — M1612 Unilateral primary osteoarthritis, left hip: Secondary | ICD-10-CM | POA: Diagnosis not present

## 2024-02-26 DIAGNOSIS — Z01818 Encounter for other preprocedural examination: Secondary | ICD-10-CM | POA: Diagnosis not present

## 2024-02-26 DIAGNOSIS — E7800 Pure hypercholesterolemia, unspecified: Secondary | ICD-10-CM | POA: Diagnosis not present

## 2024-02-26 DIAGNOSIS — I129 Hypertensive chronic kidney disease with stage 1 through stage 4 chronic kidney disease, or unspecified chronic kidney disease: Secondary | ICD-10-CM | POA: Diagnosis not present

## 2024-03-11 DIAGNOSIS — H6122 Impacted cerumen, left ear: Secondary | ICD-10-CM | POA: Diagnosis not present

## 2024-05-02 ENCOUNTER — Telehealth: Payer: Self-pay

## 2024-05-02 ENCOUNTER — Other Ambulatory Visit (HOSPITAL_COMMUNITY): Payer: Self-pay

## 2024-05-02 NOTE — Progress Notes (Addendum)
   05/02/2024  Patient ID: Fred Carroll, male   DOB: Jul 11, 1955, 68 y.o.   MRN: [id number]  Contacted patient regarding referral for medication access from Benjamine Aland, MD.   Left patient a voicemail to return my call at their convenience  Heather Factor, PharmD Clinical Pharmacist  [phone]

## 2024-05-15 NOTE — Progress Notes (Signed)
   05/15/2024  Patient ID: Fred Carroll, male   DOB: February 15, 1956, 68 y.o.   MRN: [id number]  Contacted patient regarding referral for medication access from Benjamine Aland, MD.   Left patient a voicemail to return my call at their convenience  Heather Factor, PharmD Clinical Pharmacist  [phone]

## 2024-08-09 ENCOUNTER — Encounter (INDEPENDENT_AMBULATORY_CARE_PROVIDER_SITE_OTHER): Payer: Medicare Other | Admitting: Ophthalmology
